# Patient Record
Sex: Female | Born: 1950
Health system: Southern US, Community
[De-identification: ages and names within clinical notes are randomized; demographics above are authoritative.]

## PROBLEM LIST (undated history)

## (undated) DIAGNOSIS — Z8041 Family history of malignant neoplasm of ovary: Secondary | ICD-10-CM

## (undated) DIAGNOSIS — F32A Depression, unspecified: Secondary | ICD-10-CM

## (undated) DIAGNOSIS — K589 Irritable bowel syndrome without diarrhea: Secondary | ICD-10-CM

## (undated) DIAGNOSIS — Z803 Family history of malignant neoplasm of breast: Secondary | ICD-10-CM

## (undated) DIAGNOSIS — M810 Age-related osteoporosis without current pathological fracture: Secondary | ICD-10-CM

## (undated) DIAGNOSIS — Z1371 Encounter for nonprocreative screening for genetic disease carrier status: Secondary | ICD-10-CM

## (undated) DIAGNOSIS — IMO0002 Reserved for concepts with insufficient information to code with codable children: Secondary | ICD-10-CM

## (undated) DIAGNOSIS — Z800 Family history of malignant neoplasm of digestive organs: Secondary | ICD-10-CM

## (undated) DIAGNOSIS — F329 Major depressive disorder, single episode, unspecified: Secondary | ICD-10-CM

## (undated) DIAGNOSIS — Z1379 Encounter for other screening for genetic and chromosomal anomalies: Secondary | ICD-10-CM

## (undated) HISTORY — DX: Reserved for concepts with insufficient information to code with codable children: IMO0002

## (undated) HISTORY — DX: Irritable bowel syndrome, unspecified: K58.9

## (undated) HISTORY — DX: Depression, unspecified: F32.A

## (undated) HISTORY — DX: Family history of malignant neoplasm of breast: Z80.3

## (undated) HISTORY — DX: Age-related osteoporosis without current pathological fracture: M81.0

## (undated) HISTORY — DX: Family history of malignant neoplasm of digestive organs: Z80.0

## (undated) HISTORY — DX: Major depressive disorder, single episode, unspecified: F32.9

## (undated) HISTORY — DX: Encounter for other screening for genetic and chromosomal anomalies: Z13.79

## (undated) HISTORY — DX: Family history of malignant neoplasm of ovary: Z80.41

---
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
## 1994-03-18 HISTORY — PX: NASAL SINUS SURGERY: SHX719

## 1997-09-22 ENCOUNTER — Other Ambulatory Visit: Admission: RE | Admit: 1997-09-22 | Discharge: 1997-09-22 | Payer: Self-pay | Admitting: Internal Medicine

## 1998-01-10 ENCOUNTER — Ambulatory Visit (HOSPITAL_COMMUNITY): Admission: RE | Admit: 1998-01-10 | Discharge: 1998-01-10 | Payer: Self-pay | Admitting: Obstetrics and Gynecology

## 1998-01-10 ENCOUNTER — Encounter: Payer: Self-pay | Admitting: Obstetrics and Gynecology

## 1999-01-03 ENCOUNTER — Ambulatory Visit (HOSPITAL_COMMUNITY): Admission: RE | Admit: 1999-01-03 | Discharge: 1999-01-03 | Payer: Self-pay | Admitting: Obstetrics and Gynecology

## 1999-01-03 ENCOUNTER — Encounter: Payer: Self-pay | Admitting: Obstetrics and Gynecology

## 1999-04-06 ENCOUNTER — Other Ambulatory Visit: Admission: RE | Admit: 1999-04-06 | Discharge: 1999-04-06 | Payer: Self-pay | Admitting: Obstetrics and Gynecology

## 2000-01-17 ENCOUNTER — Ambulatory Visit (HOSPITAL_COMMUNITY): Admission: RE | Admit: 2000-01-17 | Discharge: 2000-01-17 | Payer: Self-pay | Admitting: Obstetrics and Gynecology

## 2000-01-17 ENCOUNTER — Encounter: Payer: Self-pay | Admitting: Obstetrics and Gynecology

## 2000-07-08 ENCOUNTER — Other Ambulatory Visit: Admission: RE | Admit: 2000-07-08 | Discharge: 2000-07-08 | Payer: Self-pay | Admitting: Obstetrics and Gynecology

## 2001-02-02 ENCOUNTER — Ambulatory Visit (HOSPITAL_COMMUNITY): Admission: RE | Admit: 2001-02-02 | Discharge: 2001-02-02 | Payer: Self-pay | Admitting: Obstetrics and Gynecology

## 2001-02-02 ENCOUNTER — Encounter: Payer: Self-pay | Admitting: Obstetrics and Gynecology

## 2001-11-11 ENCOUNTER — Other Ambulatory Visit: Admission: RE | Admit: 2001-11-11 | Discharge: 2001-11-11 | Payer: Self-pay | Admitting: Obstetrics and Gynecology

## 2002-02-05 ENCOUNTER — Encounter: Payer: Self-pay | Admitting: Obstetrics and Gynecology

## 2002-02-05 ENCOUNTER — Ambulatory Visit (HOSPITAL_COMMUNITY): Admission: RE | Admit: 2002-02-05 | Discharge: 2002-02-05 | Payer: Self-pay | Admitting: Obstetrics and Gynecology

## 2002-04-28 ENCOUNTER — Inpatient Hospital Stay (HOSPITAL_COMMUNITY): Admission: AC | Admit: 2002-04-28 | Discharge: 2002-04-30 | Payer: Self-pay

## 2002-04-28 ENCOUNTER — Encounter: Payer: Self-pay | Admitting: Emergency Medicine

## 2002-04-29 ENCOUNTER — Encounter: Payer: Self-pay | Admitting: General Surgery

## 2002-11-19 ENCOUNTER — Other Ambulatory Visit: Admission: RE | Admit: 2002-11-19 | Discharge: 2002-11-19 | Payer: Self-pay | Admitting: Obstetrics and Gynecology

## 2003-02-18 ENCOUNTER — Ambulatory Visit (HOSPITAL_COMMUNITY): Admission: RE | Admit: 2003-02-18 | Discharge: 2003-02-18 | Payer: Self-pay | Admitting: Obstetrics and Gynecology

## 2003-12-12 ENCOUNTER — Other Ambulatory Visit: Admission: RE | Admit: 2003-12-12 | Discharge: 2003-12-12 | Payer: Self-pay | Admitting: Obstetrics and Gynecology

## 2004-03-05 ENCOUNTER — Ambulatory Visit (HOSPITAL_COMMUNITY): Admission: RE | Admit: 2004-03-05 | Discharge: 2004-03-05 | Payer: Self-pay | Admitting: Obstetrics and Gynecology

## 2004-03-18 DIAGNOSIS — IMO0002 Reserved for concepts with insufficient information to code with codable children: Secondary | ICD-10-CM

## 2004-03-18 DIAGNOSIS — R87619 Unspecified abnormal cytological findings in specimens from cervix uteri: Secondary | ICD-10-CM

## 2004-03-18 HISTORY — DX: Unspecified abnormal cytological findings in specimens from cervix uteri: R87.619

## 2004-03-18 HISTORY — DX: Reserved for concepts with insufficient information to code with codable children: IMO0002

## 2004-03-18 HISTORY — PX: COLPOSCOPY: SHX161

## 2004-08-17 ENCOUNTER — Ambulatory Visit (HOSPITAL_COMMUNITY): Admission: RE | Admit: 2004-08-17 | Discharge: 2004-08-17 | Payer: Self-pay | Admitting: Gastroenterology

## 2004-08-17 ENCOUNTER — Encounter (INDEPENDENT_AMBULATORY_CARE_PROVIDER_SITE_OTHER): Payer: Self-pay | Admitting: *Deleted

## 2005-01-04 ENCOUNTER — Other Ambulatory Visit: Admission: RE | Admit: 2005-01-04 | Discharge: 2005-01-04 | Payer: Self-pay | Admitting: Obstetrics and Gynecology

## 2005-01-16 HISTORY — PX: CLAVICLE SURGERY: SHX598

## 2005-03-01 ENCOUNTER — Ambulatory Visit (HOSPITAL_COMMUNITY): Admission: RE | Admit: 2005-03-01 | Discharge: 2005-03-01 | Payer: Self-pay | Admitting: Orthopedic Surgery

## 2005-05-31 ENCOUNTER — Other Ambulatory Visit: Admission: RE | Admit: 2005-05-31 | Discharge: 2005-05-31 | Payer: Self-pay | Admitting: Obstetrics and Gynecology

## 2005-10-04 ENCOUNTER — Other Ambulatory Visit: Admission: RE | Admit: 2005-10-04 | Discharge: 2005-10-04 | Payer: Self-pay | Admitting: Obstetrics and Gynecology

## 2005-10-30 ENCOUNTER — Ambulatory Visit (HOSPITAL_COMMUNITY): Admission: RE | Admit: 2005-10-30 | Discharge: 2005-10-30 | Payer: Self-pay | Admitting: Obstetrics and Gynecology

## 2006-01-24 ENCOUNTER — Other Ambulatory Visit: Admission: RE | Admit: 2006-01-24 | Discharge: 2006-01-24 | Payer: Self-pay | Admitting: Obstetrics and Gynecology

## 2006-03-18 DIAGNOSIS — M810 Age-related osteoporosis without current pathological fracture: Secondary | ICD-10-CM

## 2006-03-18 HISTORY — DX: Age-related osteoporosis without current pathological fracture: M81.0

## 2006-11-10 ENCOUNTER — Ambulatory Visit (HOSPITAL_COMMUNITY): Admission: RE | Admit: 2006-11-10 | Discharge: 2006-11-10 | Payer: Self-pay | Admitting: Obstetrics and Gynecology

## 2007-01-27 ENCOUNTER — Other Ambulatory Visit: Admission: RE | Admit: 2007-01-27 | Discharge: 2007-01-27 | Payer: Self-pay | Admitting: Obstetrics & Gynecology

## 2007-11-13 ENCOUNTER — Ambulatory Visit (HOSPITAL_COMMUNITY): Admission: RE | Admit: 2007-11-13 | Discharge: 2007-11-13 | Payer: Self-pay | Admitting: Gynecology

## 2008-01-28 ENCOUNTER — Other Ambulatory Visit: Admission: RE | Admit: 2008-01-28 | Discharge: 2008-01-28 | Payer: Self-pay | Admitting: Obstetrics and Gynecology

## 2008-11-15 ENCOUNTER — Ambulatory Visit (HOSPITAL_COMMUNITY): Admission: RE | Admit: 2008-11-15 | Discharge: 2008-11-15 | Payer: Self-pay | Admitting: Obstetrics and Gynecology

## 2009-11-23 ENCOUNTER — Ambulatory Visit (HOSPITAL_COMMUNITY): Admission: RE | Admit: 2009-11-23 | Discharge: 2009-11-23 | Payer: Self-pay | Admitting: Obstetrics and Gynecology

## 2010-08-03 NOTE — Op Note (Signed)
NAMEANAYA, BOVEE              ACCOUNT NO.:  [id number]   MEDICAL RECORD NO.:  [id number]          PATIENT TYPE:  AMB   LOCATION:  SDS                          FACILITY:  [facility]   PHYSICIAN:  Vania Rea. Supple, M.D.  DATE OF BIRTH:  [birth year]   DATE OF PROCEDURE:  03/01/2005  DATE OF DISCHARGE:  03/01/2005                                 OPERATIVE REPORT   PREOPERATIVE DIAGNOSIS:  Displaced left type 2 distal clavicle fracture.   POSTOPERATIVE DIAGNOSIS:  Displaced left type 2 distal clavicle fracture.   PROCEDURE:  Open reduction and internal fixation of displaced left type 2  distal clavicle fracture utilizing a coracoclavicular screw, 40 mm in  length.   SURGEON:  Vania Rea. Supple, M.D.   ASSISTANT:  Dava Najjar, P.A.-C.   ANESTHESIA:  General endotracheal anesthesia.   ESTIMATED BLOOD LOSS:  100 mL.   DRAINS:  None.   HISTORY:  Ms. Deangelo is a 60 year old female who approximately three weeks  ago fell landing directly on her left shoulder experiencing immediate pain  as well as prominence of the distal clavicle.  She was seen at an outside  facility where x-rays were obtained and followed up in our office earlier  this week where radiographs were repeated showing evidence for a displaced  type 2 distal clavicle fracture with significant prominence of the distal  clavicle into the soft tissues of the shoulder and moderate displacement and  malalignment.  Ms. Kreiser was counseled on treatment options and, at this  time, is brought to the operating room for planned ORIF due to the  significant displacement.  Preoperatively, we counseled Ms. Vint on  treatment options as well as risks versus benefits with possible  complications including bleeding, infection, neurovascular injury,  persistent pain, malunion, nonunion, loss of fixation, and possible need for  additional surgery were reviewed.  I also discussed the need for hardware  removal.  She understands, accepts, and  agrees to our planned procedure.   PROCEDURE IN DETAIL:  After undergoing routine preoperative evaluation, the  patient received prophylactic antibiotics.  She was transferred to the  operating room and placed supine on the operating table and underwent the  smooth induction of general endotracheal anesthesia.  The left shoulder  girdle region was sterilely prepped and draped in a standard fashion.  Fluoroscopic imaging was then utilized to identify the proper starting point  for the incision.  A 4 cm incision was made from anterior to posterior  across the distal clavicle just medial to the fracture site and centered  over the distal clavicle. Sharp dissection was carried down through the skin  and subcutaneous tissue and electrocautery was then used for deep dissection  and to split the deltopectoral fascia and divide the underlying musculature  to gain access to the distal clavicle.  The distal clavicle and fracture  site were then exposed with subperiosteal dissection.  The distal clavicle  was then mobilized and reduced towards the distal segment of the clavicle  fracture and then using fluoroscopic guidance, we confirmed appropriate  degree of reduction.  Once this was completed,  we then used the initial  drill bit for the DePuy coracoclavicular screw drilling through both  cortices of the clavicles medial segment.  The second smaller diameter screw  was then directed down towards the base of the coracoid and using  fluoroscopic guidance, this drill was then used to drill through the base of  the coracoid in a bicortical fashion.  The depth gauge was then passed and  the 40 mm screw was determined to be the appropriate length.  A 40 mm  coracoclavicular screw with a washer was then passed through the clavicle  drill hole then down into the base of the coracoid and obtained excellent  bony purchase and allowed excellent compression and reduction of the distal  clavicle such that the  medial segment nicely compressed against the lateral  segment.  Multiple fluoroscopic images were then used to confirm proper  positioning of the implant and good position across the fracture site.  The  wound was copiously irrigated.  Hemostasis was obtained.  The incision was  closed with 2-0 Vicryl for the deep fascia and underlying muscle belly,  subcu layer, and intracuticular 3-0 Monocryl used to close the skin followed  by Steri-Strips.  A bulky dry dressing was applied over the left shoulder.  The left arm was placed in a sling.  The patient was then extubated and  taken to the recovery room in stable condition.      Vania Rea. Supple, M.D.  Electronically Signed     KMS/MEDQ  D:  03/01/2005  T:  03/04/2005  Job:  [id number]

## 2010-11-16 ENCOUNTER — Other Ambulatory Visit: Payer: Self-pay | Admitting: Nurse Practitioner

## 2010-11-16 DIAGNOSIS — Z1231 Encounter for screening mammogram for malignant neoplasm of breast: Secondary | ICD-10-CM

## 2010-11-26 ENCOUNTER — Ambulatory Visit (HOSPITAL_COMMUNITY)
Admission: RE | Admit: 2010-11-26 | Discharge: 2010-11-26 | Disposition: A | Discharge status: Home / Self Care | Payer: Managed Care, Other (non HMO) | Source: Ambulatory Visit | Attending: Nurse Practitioner | Admitting: Nurse Practitioner

## 2010-11-26 DIAGNOSIS — Z1231 Encounter for screening mammogram for malignant neoplasm of breast: Secondary | ICD-10-CM | POA: Insufficient documentation

## 2011-12-05 ENCOUNTER — Other Ambulatory Visit: Payer: Self-pay | Admitting: Obstetrics and Gynecology

## 2011-12-05 DIAGNOSIS — Z1231 Encounter for screening mammogram for malignant neoplasm of breast: Secondary | ICD-10-CM

## 2011-12-16 ENCOUNTER — Ambulatory Visit (HOSPITAL_COMMUNITY)
Admission: RE | Admit: 2011-12-16 | Discharge: 2011-12-16 | Disposition: A | Discharge status: Home / Self Care | Payer: Managed Care, Other (non HMO) | Source: Ambulatory Visit | Attending: Obstetrics and Gynecology | Admitting: Obstetrics and Gynecology

## 2011-12-16 DIAGNOSIS — Z1231 Encounter for screening mammogram for malignant neoplasm of breast: Secondary | ICD-10-CM | POA: Insufficient documentation

## 2012-03-16 LAB — HM PAP SMEAR: HM PAP: NEGATIVE

## 2012-11-19 ENCOUNTER — Other Ambulatory Visit: Payer: Self-pay | Admitting: Nurse Practitioner

## 2012-11-19 DIAGNOSIS — Z1231 Encounter for screening mammogram for malignant neoplasm of breast: Secondary | ICD-10-CM

## 2012-12-17 ENCOUNTER — Ambulatory Visit (HOSPITAL_COMMUNITY)
Admission: RE | Admit: 2012-12-17 | Discharge: 2012-12-17 | Disposition: A | Discharge status: Home / Self Care | Payer: Managed Care, Other (non HMO) | Source: Ambulatory Visit | Attending: Nurse Practitioner | Admitting: Nurse Practitioner

## 2012-12-17 ENCOUNTER — Other Ambulatory Visit: Payer: Self-pay | Admitting: Nurse Practitioner

## 2012-12-17 DIAGNOSIS — Z1231 Encounter for screening mammogram for malignant neoplasm of breast: Secondary | ICD-10-CM

## 2013-02-25 ENCOUNTER — Other Ambulatory Visit: Payer: Self-pay | Admitting: Nurse Practitioner

## 2013-03-23 ENCOUNTER — Encounter: Payer: Self-pay | Admitting: Nurse Practitioner

## 2013-03-24 ENCOUNTER — Ambulatory Visit (INDEPENDENT_AMBULATORY_CARE_PROVIDER_SITE_OTHER): Payer: Managed Care, Other (non HMO) | Admitting: Nurse Practitioner

## 2013-03-24 ENCOUNTER — Encounter: Payer: Self-pay | Admitting: Nurse Practitioner

## 2013-03-24 VITALS — BP 110/66 | HR 72 | Ht 64.0 in | Wt 144.0 lb

## 2013-03-24 DIAGNOSIS — Z0000 Encounter for general adult medical examination without abnormal findings: Secondary | ICD-10-CM

## 2013-03-24 DIAGNOSIS — Z01419 Encounter for gynecological examination (general) (routine) without abnormal findings: Secondary | ICD-10-CM

## 2013-03-24 DIAGNOSIS — E559 Vitamin D deficiency, unspecified: Secondary | ICD-10-CM

## 2013-03-24 DIAGNOSIS — M810 Age-related osteoporosis without current pathological fracture: Secondary | ICD-10-CM | POA: Insufficient documentation

## 2013-03-24 DIAGNOSIS — N94810 Vulvar vestibulitis: Secondary | ICD-10-CM

## 2013-03-24 DIAGNOSIS — R82998 Other abnormal findings in urine: Secondary | ICD-10-CM

## 2013-03-24 DIAGNOSIS — R8290 Unspecified abnormal findings in urine: Secondary | ICD-10-CM

## 2013-03-24 LAB — POCT URINALYSIS DIPSTICK
Bilirubin, UA: NEGATIVE
GLUCOSE UA: NEGATIVE
Ketones, UA: NEGATIVE
NITRITE UA: NEGATIVE
UROBILINOGEN UA: NEGATIVE
pH, UA: 6

## 2013-03-24 MED ORDER — ESTRADIOL 2 MG VA RING: VAGINAL_RING | VAGINAL | Status: DC

## 2013-03-24 NOTE — Progress Notes (Signed)
Patient ID: Allison Thompson, female   DOB: [birth year], 63 y.o.   MRN: [id number] 63 y.o. G0P0 Single Caucasian Fe here for annual exam.  On Vit D RX at once monthly. No vaso symptoms.  Not SA for 3 years.  No LMP recorded. Patient is postmenopausal.          Sexually active: no  The current method of family planning is post menopausal status.    Exercising: yes  Gym/ health club routine includes [employer] 3-4 times per week. Smoker:  no  Health Maintenance: Pap: 03/16/12, WNL, neg HR HPV MMG:  12/17/12, 3D, Bi-Rads 1: negative Colonoscopy:  08/2004, normal, repeat 10 years BMD:  04/2011, results to orthopedist, Osteoporosis TDaP:  06/2010  Labs:  HB:  Gives blood q 2 months  Urine: Trace WBC, RBC, trace protein.   reports that she has never smoked. She has never used smokeless tobacco. She reports that she does not drink alcohol or use illicit drugs.  Past Medical History  Diagnosis Date  . Depression   . IBS (irritable bowel syndrome)   . Osteoporosis     Prolia  . Abnormal Pap smear 2006    LGSIL, pos HR HPV    Past Surgical History  Procedure Laterality Date  . Colposcopy  2006    CIN I, managed conservatively  . Umbilical hernia repair    . Forearm fracture surgery      rods placed, surgically removed later  . Nasal sinus surgery      deviated septum  . Clavicle surgery      after fracture, placed screw and later removed.    Current Outpatient Prescriptions  Medication Sig Dispense Refill  . Calcium Carbonate-Vitamin D (CALCIUM-D) 600-400 MG-UNIT TABS Take 1 tablet by mouth daily.      Marland Kitchen denosumab (PROLIA) 60 MG/ML SOLN injection Inject 60 mg into the skin every 6 (six) months. Administer in upper arm, thigh, or abdomen      . ergocalciferol (VITAMIN D2) 50000 UNITS capsule Take 50,000 Units by mouth once a week.      Marland Kitchen ESTRING 2 MG vaginal ring INSERT 1 RING VAGINALLY EVERY 3 MONTHS  1 each  0  . FLUoxetine (PROZAC) 20 MG capsule Take 60 mg by mouth daily.      .  Glucosamine 500 MG CAPS Take 1 capsule by mouth daily.      . Omega-3 Fatty Acids (FISH OIL PO) Take by mouth.      . zolpidem (AMBIEN) 5 MG tablet Take 5 mg by mouth at bedtime as needed for sleep.       No current facility-administered medications for this visit.    Family History  Problem Relation Age of Onset  . Kidney failure Mother   . Heart attack Mother   . Pancreatic cancer Cousin   . Heart disease Father   . Thyroid disease Sister   . Osteoporosis Sister   . Breast cancer Paternal Aunt   . Breast cancer Paternal Aunt     ROS:  Pertinent items are noted in HPI.  Otherwise, a comprehensive ROS was negative.  Exam:   BP 110/66  Pulse 72  Ht 5\' 4"  (1.626 m)  Wt 144 lb (65.318 kg)  BMI 24.71 kg/m2 Height: 5\' 4"  (162.6 cm)  Ht Readings from Last 3 Encounters:  03/24/13 5\' 4"  (1.626 m)    General appearance: alert, cooperative and appears stated age Head: Normocephalic, without obvious abnormality, atraumatic Neck: no adenopathy, supple, symmetrical,  trachea midline and thyroid normal to inspection and palpation Lungs: clear to auscultation bilaterally Breasts: normal appearance, no masses or tenderness Heart: regular rate and rhythm Abdomen: soft, non-tender; no masses,  no organomegaly Extremities: extremities normal, atraumatic, no cyanosis or edema Skin: Skin color, texture, turgor normal. No rashes or lesions Lymph nodes: Cervical, supraclavicular, and axillary nodes normal. No abnormal inguinal nodes palpated Neurologic: Grossly normal   Pelvic: External genitalia:  no lesions              Urethra:  normal appearing urethra with no masses, tenderness or lesions              Bartholin's and Skene's: normal                 Vagina: normal appearing vagina with normal color and discharge, no lesions              Cervix: anteverted              Pap taken: no Bimanual Exam:  Uterus:  normal size, contour, position, consistency, mobility, non-tender               Adnexa: no mass, fullness, tenderness               Rectovaginal: Confirms               Anus:  normal sphincter tone, no lesions  A:  Well Woman with normal exam  Postmenopausal  Atrophic vaginitis treated with Estring  Osteoporosis treated with Prolia since 04/2011  History of depression - death of partner 09-21-2010  History of Vulvar Vestibulitis - off Amytriptaline since not SA  P:   Pap smear as per guidelines   Mammogram due 10/15  Refill Estring for a year  She will return for labs - did not refill Vit D at this time awaiting labs - other labs are needed for the Prolia and she will bring list of labs here the next time she returns for lab and maybe can combine with our labs.  Counseled with risk of Vag E with DVT, CVA, cancer, etc  Counseled with diet , exercise, vit D , calcium, routine SBE return annually or prn  An After Visit Summary was printed and given to the patient.

## 2013-03-24 NOTE — Patient Instructions (Signed)

## 2013-03-26 LAB — URINE CULTURE
COLONY COUNT: NO GROWTH
ORGANISM ID, BACTERIA: NO GROWTH

## 2013-03-28 NOTE — Progress Notes (Signed)
Encounter reviewed by Dr. Brook Silva.  

## 2013-03-30 ENCOUNTER — Telehealth: Payer: Self-pay | Admitting: *Deleted

## 2013-03-30 NOTE — Telephone Encounter (Signed)
I have attempted to contact this patient by phone with the following results: left message to return my call on answering machine (mobile).  

## 2013-03-30 NOTE — Telephone Encounter (Signed)
Message copied by Graylon Good on Tue Mar 30, 2013  4:53 PM ------      Message from: Regina Eck      Created: Fri Mar 26, 2013  4:18 AM       Notify patient urine culture negative ------

## 2013-03-31 NOTE — Telephone Encounter (Signed)
Returning a call to [place]. Patient is aware Colletta [place] has gone for the day.Please call tomorrow.

## 2013-04-01 NOTE — Telephone Encounter (Signed)
Pt notified of results at [place].  Pt advised in message to call with any questions or if she begins having symptoms.

## 2013-04-29 ENCOUNTER — Other Ambulatory Visit (INDEPENDENT_AMBULATORY_CARE_PROVIDER_SITE_OTHER): Payer: Managed Care, Other (non HMO)

## 2013-04-29 DIAGNOSIS — E559 Vitamin D deficiency, unspecified: Secondary | ICD-10-CM

## 2013-04-29 DIAGNOSIS — Z0000 Encounter for general adult medical examination without abnormal findings: Secondary | ICD-10-CM

## 2013-04-30 LAB — LIPID PANEL
Cholesterol: 173 mg/dL (ref 0–200)
HDL: 68 mg/dL (ref >39)
LDL CALC: 94 mg/dL (ref 0–99)
Total CHOL/HDL Ratio: 2.5 Ratio
Triglycerides: 54 mg/dL (ref <150)
VLDL: 11 mg/dL (ref 0–40)

## 2013-04-30 LAB — COMPREHENSIVE METABOLIC PANEL
ALBUMIN: 4 g/dL (ref 3.5–5.2)
ALK PHOS: 41 U/L (ref 39–117)
ALT: 9 U/L (ref 0–35)
AST: 13 U/L (ref 0–37)
BUN: 11 mg/dL (ref 6–23)
CALCIUM: 8.9 mg/dL (ref 8.4–10.5)
CHLORIDE: 105 meq/L (ref 96–112)
CO2: 28 mEq/L (ref 19–32)
Creat: 0.8 mg/dL (ref 0.50–1.10)
Glucose, Bld: 82 mg/dL (ref 70–99)
POTASSIUM: 4.2 meq/L (ref 3.5–5.3)
SODIUM: 139 meq/L (ref 135–145)
Total Bilirubin: 0.5 mg/dL (ref 0.2–1.2)
Total Protein: 5.6 g/dL — ABNORMAL LOW (ref 6.0–8.3)

## 2013-04-30 LAB — CBC
HCT: 34.8 % — ABNORMAL LOW (ref 36.0–46.0)
HEMOGLOBIN: 11.7 g/dL — AB (ref 12.0–15.0)
MCH: 28.8 pg (ref 26.0–34.0)
MCHC: 33.6 g/dL (ref 30.0–36.0)
MCV: 85.7 fL (ref 78.0–100.0)
Platelets: 145 10*3/uL — ABNORMAL LOW (ref 150–400)
RBC: 4.06 MIL/uL (ref 3.87–5.11)
RDW: 13.6 % (ref 11.5–15.5)
WBC: 3.3 10*3/uL — ABNORMAL LOW (ref 4.0–10.5)

## 2013-04-30 LAB — TSH: TSH: 1.693 u[IU]/mL (ref 0.350–4.500)

## 2013-04-30 LAB — VITAMIN D 25 HYDROXY (VIT D DEFICIENCY, FRACTURES): VIT D 25 HYDROXY: 49 ng/mL (ref 30–89)

## 2013-05-03 ENCOUNTER — Telehealth: Payer: Self-pay | Admitting: Nurse Practitioner

## 2013-05-03 NOTE — Telephone Encounter (Signed)
They want the results from the blood work that was done here last.   Fax: [phone]  Put attention Aram Candela

## 2013-05-13 ENCOUNTER — Other Ambulatory Visit: Payer: Self-pay

## 2013-05-19 ENCOUNTER — Other Ambulatory Visit (INDEPENDENT_AMBULATORY_CARE_PROVIDER_SITE_OTHER): Payer: Managed Care, Other (non HMO)

## 2013-05-19 DIAGNOSIS — Z0000 Encounter for general adult medical examination without abnormal findings: Secondary | ICD-10-CM

## 2013-05-19 DIAGNOSIS — R899 Unspecified abnormal finding in specimens from other organs, systems and tissues: Secondary | ICD-10-CM

## 2013-05-20 ENCOUNTER — Other Ambulatory Visit: Payer: Self-pay | Admitting: Nurse Practitioner

## 2013-05-20 DIAGNOSIS — R899 Unspecified abnormal finding in specimens from other organs, systems and tissues: Secondary | ICD-10-CM

## 2013-05-20 LAB — CBC WITH DIFFERENTIAL/PLATELET
BASOS ABS: 0 10*3/uL (ref 0.0–0.1)
EOS ABS: 0 10*3/uL (ref 0.0–0.7)
HCT: 35.2 % — ABNORMAL LOW (ref 36.0–46.0)
Hemoglobin: 12.2 g/dL (ref 12.0–15.0)
LYMPHS PCT: 31 % (ref 12–46)
Lymphs Abs: 1 10*3/uL (ref 0.7–4.0)
MCH: 30 pg (ref 26.0–34.0)
MCHC: 34.6 g/dL (ref 30.0–36.0)
MCV: 86.5 fL (ref 78.0–100.0)
Monocytes Absolute: 0.2 10*3/uL (ref 0.1–1.0)
Monocytes Relative: 5 % (ref 3–12)
NEUTROS PCT: 64 % (ref 43–77)
Neutro Abs: 2 10*3/uL (ref 1.7–7.7)
PLATELETS: 146 10*3/uL — AB (ref 150–400)
RBC: 4.07 MIL/uL (ref 3.87–5.11)
RDW: 12.8 % (ref 11.5–15.5)
WBC: 3.1 10*3/uL — AB (ref 4.0–10.5)

## 2013-05-21 ENCOUNTER — Telehealth: Payer: Self-pay | Admitting: Nurse Practitioner

## 2013-05-21 NOTE — Telephone Encounter (Signed)
Pt returning call to [place] or [place].

## 2013-05-23 NOTE — Telephone Encounter (Signed)
Patient was informed of still low WBC.  I have consulted with Dr. Quincy Simmonds and we have agreed to recheck this in 5-6 weeks as this still may be from her giving blood every 2 months.  She is informed and has agreed and plans for a recheck of CBC with Diff in that time.  She will not be donating blood in the interim.  After review of the next CBC if needed will make appointment for a hematologist.

## 2013-08-12 ENCOUNTER — Other Ambulatory Visit: Payer: Self-pay | Admitting: Obstetrics and Gynecology

## 2013-08-12 ENCOUNTER — Telehealth: Payer: Self-pay | Admitting: Nurse Practitioner

## 2013-08-12 DIAGNOSIS — D72819 Decreased white blood cell count, unspecified: Secondary | ICD-10-CM

## 2013-08-12 NOTE — Telephone Encounter (Signed)
Done

## 2013-08-12 NOTE — Telephone Encounter (Signed)
Patient is calling saying she thinks she is supposed to have another lab appt. 6 to 8 weeks from her last one But i dont see anything can you check this for me. The only order i see if for one to be collected in march.

## 2013-08-12 NOTE — Telephone Encounter (Signed)
Spoke with pt who was supposed to have a repeat CBC w/diff in May per PG and BS. Scheduled lab appt for tomorrow at 1:30 per pt request. Dr. Quincy Simmonds, can you enter order for CBC?

## 2013-08-13 ENCOUNTER — Other Ambulatory Visit: Payer: Managed Care, Other (non HMO)

## 2013-08-13 DIAGNOSIS — D72819 Decreased white blood cell count, unspecified: Secondary | ICD-10-CM

## 2013-08-13 LAB — CBC WITH DIFFERENTIAL/PLATELET
Basophils Absolute: 0 10*3/uL (ref 0.0–0.1)
Basophils Relative: 1 % (ref 0–1)
Eosinophils Absolute: 0.2 10*3/uL (ref 0.0–0.7)
Eosinophils Relative: 4 % (ref 0–5)
HCT: 39.5 % (ref 36.0–46.0)
HEMOGLOBIN: 13.1 g/dL (ref 12.0–15.0)
LYMPHS ABS: 1.3 10*3/uL (ref 0.7–4.0)
LYMPHS PCT: 32 % (ref 12–46)
MCH: 28.2 pg (ref 26.0–34.0)
MCHC: 33.2 g/dL (ref 30.0–36.0)
MCV: 84.9 fL (ref 78.0–100.0)
Monocytes Absolute: 0.3 10*3/uL (ref 0.1–1.0)
Monocytes Relative: 7 % (ref 3–12)
NEUTROS PCT: 56 % (ref 43–77)
Neutro Abs: 2.2 10*3/uL (ref 1.7–7.7)
PLATELETS: 185 10*3/uL (ref 150–400)
RBC: 4.65 MIL/uL (ref 3.87–5.11)
RDW: 13.4 % (ref 11.5–15.5)
WBC: 4 10*3/uL (ref 4.0–10.5)

## 2013-08-16 NOTE — Telephone Encounter (Signed)
Patient had lab appointment. Will close encounter.

## 2013-11-24 ENCOUNTER — Other Ambulatory Visit: Payer: Self-pay | Admitting: Nurse Practitioner

## 2013-11-24 DIAGNOSIS — Z1231 Encounter for screening mammogram for malignant neoplasm of breast: Secondary | ICD-10-CM

## 2013-12-23 ENCOUNTER — Ambulatory Visit (HOSPITAL_COMMUNITY): Payer: Managed Care, Other (non HMO)

## 2013-12-31 ENCOUNTER — Ambulatory Visit (HOSPITAL_COMMUNITY)
Admission: RE | Admit: 2013-12-31 | Discharge: 2013-12-31 | Disposition: A | Discharge status: Home / Self Care | Payer: Managed Care, Other (non HMO) | Source: Ambulatory Visit | Attending: Nurse Practitioner | Admitting: Nurse Practitioner

## 2013-12-31 DIAGNOSIS — Z1231 Encounter for screening mammogram for malignant neoplasm of breast: Secondary | ICD-10-CM | POA: Diagnosis present

## 2014-03-28 ENCOUNTER — Encounter: Payer: Self-pay | Admitting: Nurse Practitioner

## 2014-03-28 ENCOUNTER — Ambulatory Visit (INDEPENDENT_AMBULATORY_CARE_PROVIDER_SITE_OTHER): Payer: BLUE CROSS/BLUE SHIELD | Admitting: Nurse Practitioner

## 2014-03-28 VITALS — BP 100/70 | HR 60 | Resp 18 | Ht 63.75 in | Wt 141.0 lb

## 2014-03-28 DIAGNOSIS — R8290 Unspecified abnormal findings in urine: Secondary | ICD-10-CM

## 2014-03-28 DIAGNOSIS — Z01419 Encounter for gynecological examination (general) (routine) without abnormal findings: Secondary | ICD-10-CM

## 2014-03-28 DIAGNOSIS — Z0000 Encounter for general adult medical examination without abnormal findings: Secondary | ICD-10-CM

## 2014-03-28 LAB — COMPREHENSIVE METABOLIC PANEL
ALBUMIN: 4 g/dL (ref 3.5–5.2)
ALT: 16 U/L (ref 0–35)
AST: 16 U/L (ref 0–37)
Alkaline Phosphatase: 41 U/L (ref 39–117)
BILIRUBIN TOTAL: 0.7 mg/dL (ref 0.2–1.2)
BUN: 12 mg/dL (ref 6–23)
CHLORIDE: 103 meq/L (ref 96–112)
CO2: 28 meq/L (ref 19–32)
Calcium: 8.9 mg/dL (ref 8.4–10.5)
Creat: 0.78 mg/dL (ref 0.50–1.10)
GLUCOSE: 82 mg/dL (ref 70–99)
Potassium: 4 mEq/L (ref 3.5–5.3)
Sodium: 139 mEq/L (ref 135–145)
Total Protein: 6.2 g/dL (ref 6.0–8.3)

## 2014-03-28 LAB — LIPID PANEL
Cholesterol: 203 mg/dL — ABNORMAL HIGH (ref 0–200)
HDL: 82 mg/dL (ref >39)
LDL Cholesterol: 105 mg/dL — ABNORMAL HIGH (ref 0–99)
Total CHOL/HDL Ratio: 2.5 Ratio
Triglycerides: 79 mg/dL (ref <150)
VLDL: 16 mg/dL (ref 0–40)

## 2014-03-28 LAB — POCT URINALYSIS DIPSTICK
Bilirubin, UA: NEGATIVE
Blood, UA: NEGATIVE
Glucose, UA: NEGATIVE
Ketones, UA: NEGATIVE
Nitrite, UA: NEGATIVE
Protein, UA: NEGATIVE
Urobilinogen, UA: NEGATIVE
pH, UA: 5

## 2014-03-28 LAB — TSH: TSH: 1.159 u[IU]/mL (ref 0.350–4.500)

## 2014-03-28 MED ORDER — ESTRADIOL 2 MG VA RING: VAGINAL_RING | VAGINAL | Status: DC

## 2014-03-28 NOTE — Patient Instructions (Signed)

## 2014-03-28 NOTE — Progress Notes (Signed)
64 y.o. G1P0010 Single Caucasian Fe here for annual exam.  No vaso symptoms. No mood changes. No new health problems. Prolia X 2 years. Previous Forteo 03/29/2009 X 2 years. No UA symptoms.  Patient's last menstrual period was 08/16/2004.          Sexually active: No.  The current method of family planning is post menopausal status.    Exercising: Yes.    Curves 2 - 3 x weekly Smoker:  no  Health Maintenance: Pap: 02/2012 Neg. HR HPV:Neg  History of CIN I  01/28/2005 with Colpo biopsy benign  MMG: 01/03/14 BIRADS1:neg Colonoscopy:  08/2004 Repeat 10 year will schedule later this summer BMD:   04/2013 Improved Osteoporosis - By Dr. Layne Benton  TDaP: 06/2010 Labs: Here today UA:WBC=Small Hg:13.5   reports that she has never smoked. She has never used smokeless tobacco. She reports that she does not drink alcohol or use illicit drugs.  Past Medical History  Diagnosis Date  . Depression   . IBS (irritable bowel syndrome)   . Abnormal Pap smear 2006    LGSIL, pos HR HPV  . Osteoporosis 2008    Prolia since 04/2011 given by Ortho    Past Surgical History  Procedure Laterality Date  . Colposcopy  2006    CIN I, managed conservatively  . Umbilical hernia repair  1958  . Forearm fracture surgery Right 1967    rods placed, surgically removed later  . Nasal sinus surgery  1996    deviated septum  . Clavicle surgery  11/06    after fracture, placed screw and later removed.    Current Outpatient Prescriptions  Medication Sig Dispense Refill  . Calcium Carbonate-Vitamin D (CALCIUM-D) 600-400 MG-UNIT TABS Take 1 tablet by mouth daily.    . cholecalciferol (VITAMIN D) 1000 UNITS tablet Take 1,000 Units by mouth daily.    Marland Kitchen denosumab (PROLIA) 60 MG/ML SOLN injection Inject 60 mg into the skin every 6 (six) months. Administer in upper arm, thigh, or abdomen    . EPIPEN 2-PAK 0.3 MG/0.3ML SOAJ injection See admin instructions.  1  . estradiol (ESTRING) 2 MG vaginal ring INSERT 1 RING VAGINALLY  EVERY 3 MONTHS 1 each 3  . FLUoxetine (PROZAC) 20 MG capsule Take 60 mg by mouth daily.    . Glucosamine 500 MG CAPS Take 1 capsule by mouth daily.    . Omega-3 Fatty Acids (FISH OIL PO) Take by mouth.    . zolpidem (AMBIEN) 10 MG tablet Take 5 mg by mouth at bedtime as needed.      No current facility-administered medications for this visit.    @FAM  HX@  ROS:  Pertinent items are noted in HPI.  Otherwise, a comprehensive ROS was negative.  Exam:   BP 100/70 mmHg  Pulse 60  Resp 18  Ht 5' 3.75" (1.619 m)  Wt 141 lb (63.957 kg)  BMI 24.40 kg/m2  LMP 08/16/2004 Height: 5' 3.75" (161.9 cm) @LAST  HT(3)@  General appearance: alert, cooperative and appears stated age Head: Normocephalic, without obvious abnormality, atraumatic Neck: no adenopathy, supple, symmetrical, trachea midline and thyroid normal to inspection and palpation Lungs: clear to auscultation bilaterally Breasts: normal appearance, no masses or tenderness Heart: regular rate and rhythm Abdomen: soft, non-tender; no masses,  no organomegaly Extremities: extremities normal, atraumatic, no cyanosis or edema Skin: Skin color, texture, turgor normal. No rashes or lesions Lymph nodes: Cervical, supraclavicular, and axillary nodes normal. No abnormal inguinal nodes palpated Neurologic: Grossly normal   Pelvic: External  genitalia:  no lesions              Urethra:  normal appearing urethra with no masses, tenderness or lesions              Bartholin's and Skene's: normal                 Vagina: normal appearing vagina with normal color and discharge, no lesions              Cervix: anteverted              Pap taken: No. Bimanual Exam:  Uterus:  normal size, contour, position, consistency, mobility, non-tender              Adnexa: no mass, fullness, tenderness               Rectovaginal: Confirms               Anus:  normal sphincter tone, no lesions  Chaperone present: Yes  A:  Well Woman with normal  exam  Postmenopausal Atrophic vaginitis treated with Estring Osteoporosis treated with Prolia since 04/2011 X 2  Previously treated with Forteo 03/29/2009 X 2 years History of depression - death of partner 2010/11/04 History of Vulvar Vestibulitis - off Amytriptaline since not SA  P:   Reviewed health and wellness pertinent to exam  Pap smear not taken today  Mammogram is due 10/15  Colonoscopy is due 08/2014  Refill on Estring for a year - given new coupon  Counseled with risk of DVT, CVA, cancer, etc  Counseled on breast self exam, mammography screening, use and side effects of HRT, adequate intake of calcium and vitamin D, diet and exercise, Kegel's exercises return annually or prn  An After Visit Summary was printed and given to the patient.

## 2014-03-29 LAB — URINALYSIS, MICROSCOPIC ONLY
Bacteria, UA: NONE SEEN
CASTS: NONE SEEN
CRYSTALS: NONE SEEN

## 2014-03-29 LAB — HEMOGLOBIN, FINGERSTICK: Hemoglobin, fingerstick: 13.5 g/dL (ref 12.0–16.0)

## 2014-03-29 LAB — VITAMIN D 25 HYDROXY (VIT D DEFICIENCY, FRACTURES): VIT D 25 HYDROXY: 51 ng/mL (ref 30–100)

## 2014-03-29 NOTE — Progress Notes (Signed)
Encounter reviewed by Dr. Brook Silva.  

## 2014-03-30 LAB — URINE CULTURE
COLONY COUNT: NO GROWTH
ORGANISM ID, BACTERIA: NO GROWTH

## 2014-04-01 ENCOUNTER — Telehealth: Payer: Self-pay | Admitting: *Deleted

## 2014-04-01 NOTE — Telephone Encounter (Signed)
-----   Message from Milford Cage, [place] sent at 03/30/2014 12:58 PM EST ----- Let parent know that urine culture is negative.

## 2014-04-01 NOTE — Telephone Encounter (Signed)
I have attempted to contact this patient by phone with the following results: left message to return call to [place] at [phone] answering machine (home per [facility]).  No personal information given-name was not identified in voicemail.  [phone] (Home), no connection with mobile number, unable to leave message.

## 2014-04-01 NOTE — Telephone Encounter (Signed)
Returning call.

## 2014-04-06 NOTE — Telephone Encounter (Signed)
I have attempted to contact this patient by phone with the following results: left message to return call to [place] at [phone] answering machine (home per [facility]). Advised call was regarding recent labs and if I am unavailable to ask for a triage nurse to review these with her. [phone] (Home), no connection with mobile number, unable to leave message.

## 2014-04-06 NOTE — Telephone Encounter (Signed)
Patient notified see result note 

## 2014-04-15 ENCOUNTER — Telehealth: Payer: Self-pay | Admitting: Nurse Practitioner

## 2014-04-15 MED ORDER — ESTRADIOL 2 MG VA RING: VAGINAL_RING | VAGINAL | Status: DC

## 2014-04-15 NOTE — Telephone Encounter (Signed)
Medication refill request: Estring 2Mg  Last AEX:  03/28/14 Next AEX: 03/30/15 Last MMG (if hormonal medication request): 01/03/14 BIRADS1:Neg Refill authorized: 03/28/14 #1each/3R.

## 2014-04-15 NOTE — Telephone Encounter (Signed)
Patient is asking to pick up a hard copy prescription for Estring and take to her pharmacy with a coupon. (due to cost)

## 2014-04-15 NOTE — Telephone Encounter (Addendum)
LM for pt Rx and coupon to pick up on Monday.

## 2014-11-17 ENCOUNTER — Other Ambulatory Visit: Payer: Self-pay | Admitting: Nurse Practitioner

## 2014-11-17 NOTE — Telephone Encounter (Signed)
Medication refill request: Estring Vaginal Ring Last AEX:  03/28/2014 with PG Next AEX:  04/03/15 with PG Last MMG (if hormonal medication request): 01/03/2014 breast density category b; bi-rads 1: negative Refill authorized: #1/2  04/15/14 #1/3 rfs was sent to [facility] Left Message To Call Back to see where patient is needing rx sent to.

## 2014-11-17 NOTE — Telephone Encounter (Signed)
Patient says she now gets her estring from her [facility] and they have a current rx she said she has an additional refill that will last her until her appt with Ms. Patty in January.

## 2014-12-26 ENCOUNTER — Other Ambulatory Visit: Payer: Self-pay

## 2014-12-26 DIAGNOSIS — Z1231 Encounter for screening mammogram for malignant neoplasm of breast: Secondary | ICD-10-CM

## 2015-01-03 ENCOUNTER — Ambulatory Visit
Admission: RE | Admit: 2015-01-03 | Discharge: 2015-01-03 | Disposition: A | Discharge status: Home / Self Care | Payer: BLUE CROSS/BLUE SHIELD | Source: Ambulatory Visit

## 2015-01-03 DIAGNOSIS — Z1231 Encounter for screening mammogram for malignant neoplasm of breast: Secondary | ICD-10-CM

## 2015-01-29 ENCOUNTER — Other Ambulatory Visit: Payer: Self-pay | Admitting: Nurse Practitioner

## 2015-01-30 NOTE — Telephone Encounter (Signed)
Medication refill request: Estring  Last AEX:  03-28-14 Next AEX: 04-03-15  Last MMG (if hormonal medication request): 01-04-15 WNL Refill authorized: please advise

## 2015-03-30 ENCOUNTER — Ambulatory Visit: Payer: BLUE CROSS/BLUE SHIELD | Admitting: Nurse Practitioner

## 2015-04-03 ENCOUNTER — Ambulatory Visit (INDEPENDENT_AMBULATORY_CARE_PROVIDER_SITE_OTHER): Payer: BLUE CROSS/BLUE SHIELD | Admitting: Nurse Practitioner

## 2015-04-03 ENCOUNTER — Encounter: Payer: Self-pay | Admitting: Nurse Practitioner

## 2015-04-03 VITALS — BP 90/60 | HR 64 | Resp 16 | Ht 63.75 in | Wt 145.0 lb

## 2015-04-03 DIAGNOSIS — Z0000 Encounter for general adult medical examination without abnormal findings: Secondary | ICD-10-CM | POA: Diagnosis not present

## 2015-04-03 DIAGNOSIS — N952 Postmenopausal atrophic vaginitis: Secondary | ICD-10-CM

## 2015-04-03 DIAGNOSIS — Z01419 Encounter for gynecological examination (general) (routine) without abnormal findings: Secondary | ICD-10-CM

## 2015-04-03 LAB — POCT URINALYSIS DIPSTICK
Bilirubin, UA: NEGATIVE
Glucose, UA: NEGATIVE
Ketones, UA: NEGATIVE
NITRITE UA: NEGATIVE
PH UA: 6
Protein, UA: NEGATIVE
RBC UA: NEGATIVE
UROBILINOGEN UA: NEGATIVE

## 2015-04-03 LAB — COMPREHENSIVE METABOLIC PANEL
ALT: 15 U/L (ref 6–29)
AST: 16 U/L (ref 10–35)
Albumin: 3.9 g/dL (ref 3.6–5.1)
Alkaline Phosphatase: 39 U/L (ref 33–130)
BILIRUBIN TOTAL: 0.6 mg/dL (ref 0.2–1.2)
BUN: 11 mg/dL (ref 7–25)
CALCIUM: 8.9 mg/dL (ref 8.6–10.4)
CO2: 28 mmol/L (ref 20–31)
CREATININE: 0.76 mg/dL (ref 0.50–0.99)
Chloride: 101 mmol/L (ref 98–110)
GLUCOSE: 79 mg/dL (ref 65–99)
Potassium: 4 mmol/L (ref 3.5–5.3)
SODIUM: 139 mmol/L (ref 135–146)
Total Protein: 6 g/dL — ABNORMAL LOW (ref 6.1–8.1)

## 2015-04-03 LAB — CBC WITH DIFFERENTIAL/PLATELET
Basophils Absolute: 0 10*3/uL (ref 0.0–0.1)
Basophils Relative: 1 % (ref 0–1)
EOS ABS: 0.1 10*3/uL (ref 0.0–0.7)
Eosinophils Relative: 3 % (ref 0–5)
HCT: 42.8 % (ref 36.0–46.0)
Hemoglobin: 13.9 g/dL (ref 12.0–15.0)
LYMPHS ABS: 1.2 10*3/uL (ref 0.7–4.0)
Lymphocytes Relative: 30 % (ref 12–46)
MCH: 28.2 pg (ref 26.0–34.0)
MCHC: 32.5 g/dL (ref 30.0–36.0)
MCV: 86.8 fL (ref 78.0–100.0)
MONO ABS: 0.3 10*3/uL (ref 0.1–1.0)
MONOS PCT: 8 % (ref 3–12)
MPV: 9.5 fL (ref 8.6–12.4)
NEUTROS PCT: 58 % (ref 43–77)
Neutro Abs: 2.4 10*3/uL (ref 1.7–7.7)
PLATELETS: 173 10*3/uL (ref 150–400)
RBC: 4.93 MIL/uL (ref 3.87–5.11)
RDW: 13.7 % (ref 11.5–15.5)
WBC: 4.1 10*3/uL (ref 4.0–10.5)

## 2015-04-03 LAB — LIPID PANEL
Cholesterol: 205 mg/dL — ABNORMAL HIGH (ref 125–200)
HDL: 83 mg/dL (ref >=46)
LDL CALC: 106 mg/dL (ref <130)
Total CHOL/HDL Ratio: 2.5 Ratio (ref <=5.0)
Triglycerides: 81 mg/dL (ref <150)
VLDL: 16 mg/dL (ref <30)

## 2015-04-03 LAB — HEMOGLOBIN, FINGERSTICK: Hemoglobin, fingerstick: 13.4 g/dL (ref 12.0–16.0)

## 2015-04-03 LAB — TSH: TSH: 1.795 u[IU]/mL (ref 0.350–4.500)

## 2015-04-03 MED ORDER — ESTRADIOL 2 MG VA RING: VAGINAL_RING | VAGINAL | Status: DC

## 2015-04-03 NOTE — Patient Instructions (Signed)

## 2015-04-03 NOTE — Progress Notes (Signed)
65 y.o. G1P0010 Single  Caucasian Fe here for annual exam.  Not dating or SA. Occasionally night sweats.  Off Estring for about 2-3 months secondary cost.  Wants to get a new RX and she will use the coupon. . Patient's last menstrual period was 08/16/2004.          Sexually active: No.  The current method of family planning is post menopausal status.    Exercising: Yes.    Curves Smoker:  no  Health Maintenance: Pap:  03/06/12  Neg. HR HPV:neg. History of CIN I 01/28/2005 with Colpo biopsy benign  MMG:  01/04/15 BIRADS1:neg Colonoscopy:  08/2004 Repeat 10 years  BMD:   04/06/14 Osteoporosis by Dr. Layne Benton  TDaP:  2012 Shingles: No Pneumonia: No Hep C and HIV: No Labs: Here today Hg:13.4 UA: WBC=Trace   reports that she has never smoked. She has never used smokeless tobacco. She reports that she does not drink alcohol or use illicit drugs.  Past Medical History  Diagnosis Date  . Depression   . IBS (irritable bowel syndrome)   . Abnormal Pap smear 2006    LGSIL, pos HR HPV  . Osteoporosis 2008    Prolia since 04/2011 given by Ortho    Past Surgical History  Procedure Laterality Date  . Colposcopy  2006    CIN I, managed conservatively  . Umbilical hernia repair  1958  . Forearm fracture surgery Right 1967    rods placed, surgically removed later  . Nasal sinus surgery  1996    deviated septum  . Clavicle surgery  11/06    after fracture, placed screw and later removed.    Current Outpatient Prescriptions  Medication Sig Dispense Refill  . Calcium Carbonate-Vitamin D (CALCIUM-D) 600-400 MG-UNIT TABS Take 1 tablet by mouth daily.    . cholecalciferol (VITAMIN D) 1000 UNITS tablet Take 1,000 Units by mouth daily.    Marland Kitchen denosumab (PROLIA) 60 MG/ML SOLN injection Inject 60 mg into the skin every 6 (six) months. Administer in upper arm, thigh, or abdomen    . EPIPEN 2-PAK 0.3 MG/0.3ML SOAJ injection See admin instructions.  1  . estradiol (ESTRING) 2 MG vaginal ring INSERT 1  RING VAGINALLY ONCE EVERY 3 MONTHS 1 each 4  . FLUoxetine (PROZAC) 20 MG capsule Take 60 mg by mouth daily.    . Glucosamine 500 MG CAPS Take 1 capsule by mouth daily.    . Omega-3 Fatty Acids (FISH OIL PO) Take by mouth.    . zolpidem (AMBIEN) 10 MG tablet Take 5 mg by mouth at bedtime as needed.      No current facility-administered medications for this visit.    Family History  Problem Relation Age of Onset  . Kidney failure Mother   . Heart attack Mother   . Pancreatic cancer Cousin   . Heart disease Father   . Thyroid disease Sister   . Osteoporosis Sister   . Breast cancer Paternal Aunt   . Cancer Paternal Aunt     Pancreas  . Breast cancer Paternal Aunt     ROS:  Pertinent items are noted in HPI.  Otherwise, a comprehensive ROS was negative.  Exam:   BP 90/60 mmHg  Pulse 64  Resp 16  Ht 5' 3.75" (1.619 m)  Wt 145 lb (65.772 kg)  BMI 25.09 kg/m2  LMP 08/16/2004 Height: 5' 3.75" (161.9 cm) Ht Readings from Last 3 Encounters:  04/03/15 5' 3.75" (1.619 m)  03/28/14 5' 3.75" (1.619 m)  03/24/13 5\' 4"  (1.626 m)    General appearance: alert, cooperative and appears stated age Head: Normocephalic, without obvious abnormality, atraumatic Neck: no adenopathy, supple, symmetrical, trachea midline and thyroid normal to inspection and palpation Lungs: clear to auscultation bilaterally Breasts: normal appearance, no masses or tenderness Heart: regular rate and rhythm Abdomen: soft, non-tender; no masses,  no organomegaly Extremities: extremities normal, atraumatic, no cyanosis or edema Skin: Skin color, texture, turgor normal. No rashes or lesions Lymph nodes: Cervical, supraclavicular, and axillary nodes normal. No abnormal inguinal nodes palpated Neurologic: Grossly normal   Pelvic: External genitalia:  no lesions              Urethra:  normal appearing urethra with no masses, tenderness or lesions              Bartholin's and Skene's: normal                 Vagina:  normal appearing vagina with normal color and discharge, no lesions              Cervix: anteverted              Pap taken: Yes.   Bimanual Exam:  Uterus:  normal size, contour, position, consistency, mobility, non-tender              Adnexa: no mass, fullness, tenderness               Rectovaginal: Confirms               Anus:  normal sphincter tone, no lesions  Chaperone present: yes  A:  Well Woman with normal exam  Postmenopausal Atrophic vaginitis treated with Estring Osteoporosis treated with Prolia since 04/2011 X 3 years Previously treated with Forteo 03/29/2009 X 2 years History of depression - death of partner 10/09/2010 History of Vulvar Vestibulitis - off Amytriptaline since not SA  P:   Reviewed health and wellness pertinent to exam  Pap smear as above  Mammogram is due 12/2015  Refill of Estring for a year  Counseled with risk of CVA, DVT, cancer, etc  Will follow with labs  Counseled on breast self exam, mammography screening, use and side effects of HRT, osteoporosis, adequate intake of calcium and vitamin D, diet and exercise return annually or prn  An After Visit Summary was printed and given to the patient.

## 2015-04-04 LAB — HIV ANTIBODY (ROUTINE TESTING W REFLEX): HIV: NONREACTIVE

## 2015-04-04 LAB — HEPATITIS C ANTIBODY: HCV AB: NEGATIVE

## 2015-04-04 LAB — VITAMIN D 25 HYDROXY (VIT D DEFICIENCY, FRACTURES): VIT D 25 HYDROXY: 39 ng/mL (ref 30–100)

## 2015-04-05 LAB — IPS PAP TEST WITH HPV

## 2015-04-05 NOTE — Progress Notes (Signed)
Encounter reviewed by Dr. Brook Amundson C. Silva.  

## 2015-08-07 ENCOUNTER — Encounter: Payer: Self-pay | Admitting: Nurse Practitioner

## 2015-08-07 ENCOUNTER — Ambulatory Visit (INDEPENDENT_AMBULATORY_CARE_PROVIDER_SITE_OTHER): Payer: BLUE CROSS/BLUE SHIELD | Admitting: Nurse Practitioner

## 2015-08-07 VITALS — BP 114/72 | HR 76 | Ht 63.75 in | Wt 143.0 lb

## 2015-08-07 DIAGNOSIS — Z803 Family history of malignant neoplasm of breast: Secondary | ICD-10-CM

## 2015-08-07 DIAGNOSIS — Z800 Family history of malignant neoplasm of digestive organs: Secondary | ICD-10-CM | POA: Diagnosis not present

## 2015-08-07 DIAGNOSIS — Z8041 Family history of malignant neoplasm of ovary: Secondary | ICD-10-CM

## 2015-08-07 NOTE — Progress Notes (Signed)
64 y.o. Single Caucasian female [id number] here for consultation.  Her sister age 69 was recently diagnosed with OV cancer.  With that the sister had genetic testing positive for mutations that is at risk for breast, OV, pancreatic, prostate cancer.  All this information is new and sister has just had surgery and will be having chemo.  Patient was told that she also should be tested.  She comes in to get a referral and see what else she needs to have done.  Sister has a daughter age 31 and has not yet been tested.   Other FMH: 2 paternal aunts with breast cancer about age 55 & 68 One of the aunts then had pancreatic cancer age 81 1 paternal cousin with pancreatic cancer age 51 and passed within 6 months   Pt had colonoscopy in 05/2015 and was normal with a recheck in 10 yrs.    O: Healthy WD,WN female Affect: normal   A: FMH: OV and pancreatic cancer  Need for genetic counseling    P:  Referral is made for genetic counseling  Information is given for BRCA and OV cancer   RV  

## 2015-08-07 NOTE — Patient Instructions (Signed)
Genetic Counseling WHAT IS GENETIC COUNSELING?  Genetic counseling is a meeting with a trained health professional (geneticist or Dietitian) about:   Genetic or chromosomal disorders.  Your risks for health conditions that are passed down through families (inherited). This information is kept private and confidential. Genetic counseling can:  Explain the diagnosis of a condition.  Tell you about your risk of developing certain conditions, such as cancer.  Help you understand patterns of disease in your family.  Help you find resources for coping with a diagnosis.  Help you plan for your next steps. This may include referrals to specialists. WHO DOES GENETIC COUNSELING?  People who do genetic counseling are health professionals, including:   Medical doctors.  Nurses.  Social workers.  Researchers with training in genetic conditions. Some genetic counselors specialize in specific conditions, age groups, or groups of people. Sometimes counselors will also refer you to a physician who specializes in a particular condition.  WHO SHOULD GET GENETIC COUNSELING?  You may benefit from genetic counseling if:  You have a condition that is:  Genetic.  Chromosomal.  Inherited.  You have a child with a genetic or chromosomal condition.  You want to know more about your risk for inherited conditions. These include:  Heart disease.  Cancer.  Blood disorders.  Mental illness.  You want to know how your genetic risk might be affected by lifestyle choices.  You are worried about passing on an inherited condition to your child.  You want to understand or respond to the results of genetic testing.  You have had two or more miscarriages or stillbirths and you want to become pregnant.  You are pregnant and you are 8 years of age or older. This is when the risk of genetic abnormalities increases.  You want to know more about the risk of conditions that are common among  your ethnic group. HOW CAN I FIND A GENETIC COUNSELOR?   Ask your health care provider to suggest a genetic counselor in your area.  Search online on the website of the [employer]. The website is: LocalLamps.nl HOW SHOULD I PREPARE FOR MY GENETIC COUNSELING SESSION?  Find out if counseling and testing are covered under your health insurance plan.  Make sure you have all recommended screenings or tests.  Gather your personal medical records.  Bring information about your family's medical history.  Write down any questions you may have. WHAT CAN I EXPECT DURING MY GENETIC COUNSELING SESSION? Plan to talk about:  Your family and personal medical history.  Possible patterns of inherited conditions.  Genetic testing options or the results of genetic tests.  The results of tests for genetic conditions.  The meaning of a diagnosis, if your health care provider says it is genetic, inherited, or chromosomal.  Strategies for preventing, identifying, or managing genetic conditions.  Resources for further information, support, or care. WHAT HAPPENS AFTER GENETIC COUNSELING?   You should receive a letter summarizing the information you discussed with your genetic counselor.  You may want to follow up with specialists or other resources for support and information.  You can talk to your genetic counselor again for more information or support.   This information is not intended to replace advice given to you by your health care provider. Make sure you discuss any questions you have with your health care provider.   Document Released: 05/25/2002 Document Revised: 03/25/2014 Document Reviewed: 11/10/2013 Elsevier Interactive Patient Education [employer].

## 2015-08-14 NOTE — Progress Notes (Signed)
Encounter reviewed by Dr. Brook Amundson C. Silva.  

## 2015-08-16 ENCOUNTER — Encounter: Payer: Self-pay | Admitting: Genetic Counselor

## 2015-08-17 ENCOUNTER — Encounter: Payer: Self-pay | Admitting: Genetic Counselor

## 2015-08-23 ENCOUNTER — Telehealth: Payer: Self-pay | Admitting: Nurse Practitioner

## 2015-08-23 NOTE — Telephone Encounter (Signed)
Left voicemail regarding referral appointment. The information is listed below. Should the patient need to cancel or reschedule this appointment, please advise them to call the office they've been referred to in order to reschedule.  Genetic Counseling at [facility] [place]           **located directly next to [phone]  Please arrive 30 minutes early with insurance card and photo id.

## 2015-09-27 ENCOUNTER — Encounter: Payer: Self-pay | Admitting: Genetic Counselor

## 2015-09-27 ENCOUNTER — Ambulatory Visit (HOSPITAL_BASED_OUTPATIENT_CLINIC_OR_DEPARTMENT_OTHER): Payer: BLUE CROSS/BLUE SHIELD | Admitting: Genetic Counselor

## 2015-09-27 ENCOUNTER — Other Ambulatory Visit: Payer: BLUE CROSS/BLUE SHIELD

## 2015-09-27 DIAGNOSIS — Z315 Encounter for genetic counseling: Secondary | ICD-10-CM

## 2015-09-27 DIAGNOSIS — Z800 Family history of malignant neoplasm of digestive organs: Secondary | ICD-10-CM | POA: Diagnosis not present

## 2015-09-27 DIAGNOSIS — Z8041 Family history of malignant neoplasm of ovary: Secondary | ICD-10-CM

## 2015-09-27 DIAGNOSIS — Z803 Family history of malignant neoplasm of breast: Secondary | ICD-10-CM

## 2015-09-27 NOTE — Progress Notes (Signed)
REFERRING PROVIDER: No referring provider defined for this encounter.  PRIMARY PROVIDER:  No PCP Per Patient  PRIMARY REASON FOR VISIT:  1. Family history of breast cancer      HISTORY OF PRESENT ILLNESS:   Ms. Bribiesca, a 65 y.o. female, was seen for a [place] cancer genetics consultation at the request of Dr. No ref. provider found due to a family history of cancer.  Ms. Eppard presents to clinic today to discuss the possibility of a hereditary predisposition to cancer, genetic testing, and to further clarify her future cancer risks, as well as potential cancer risks for family members.    Ms. Depass is a 65 y.o. female with no personal history of cancer.    CANCER HISTORY:   No history exists.     HORMONAL RISK FACTORS:  Menarche was at age 77.  First live birth at age N/A.  OCP use for approximately 21 years.  Ovaries intact: yes.  Hysterectomy: no.  Menopausal status: postmenopausal.  HRT use: 0 years. Colonoscopy: yes; normal. Mammogram within the last year: yes. Number of breast biopsies: 0. Up to date with pelvic exams:  yes. 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 Depression   . IBS (irritable bowel syndrome)   . Abnormal Pap smear 2006    LGSIL, pos HR HPV  . Osteoporosis 2008    Prolia since 04/2011 given by Ortho  . Family history of breast cancer   . Family history of pancreatic cancer   . Family history of ovarian cancer     sister dx at 22yr  . Family history of breast cancer   . Family history of pancreatic cancer   . Family history of ovarian cancer     Past Surgical History  Procedure Laterality Date  . Colposcopy  2006    CIN I, managed conservatively  . Umbilical hernia repair  1958  . Forearm fracture surgery Right 1967    rods placed, surgically removed later  . Nasal sinus surgery  1996    deviated septum  .  Clavicle surgery  11/06    after fracture, placed screw and later removed.    Social History   Social History  . Marital Status: Single    Spouse Name: N/A  . Number of Children: 0  . Years of Education: N/A   Occupational History  . FLIGHT ATTENDANT    Social History Main Topics  . Smoking status: Never Smoker   . Smokeless tobacco: Never Used  . Alcohol Use: No  . Drug Use: No  . Sexual Activity: Not Currently    Birth Control/ Protection: Post-menopausal   Other Topics Concern  . Not on file   Social History Narrative     FAMILY HISTORY:  We obtained a detailed, 4-generation family history.  Significant diagnoses are listed below: Family History  Problem Relation Age of Onset  . Kidney failure Mother   . Heart attack Mother   . Pancreatic cancer Cousin 53   died within 651 months . Heart disease Father   . Prostate cancer Father 782   d.74  . Thyroid disease Sister   . Osteoporosis Sister   . Ovarian cancer Sister 623 . Breast cancer Paternal AAunt 26   mastectomy  . Pancreatic cancer Paternal Aunt 81    chemo and radiation  .  Breast cancer Paternal Aunt 8  . Prostate cancer Paternal Uncle 63  . Testicular cancer Maternal Grandfather    Ms. Redford has one sister who was diagnosed with Ovarian Cancer at 36 years old. Pt sister is currently 52 and had a pathogenic mutation her the ATM gene. She has one daughter who is 42 years old and is planning to have testing. Patient's mother passed at 50 years of age with no cancer history. Patient's maternal uncle passed at 45 years of age from possible heart complications or suicide and did not have any children. Patient's maternal grandmother passed away at 49 years with no cancer history. Patient's maternal grandfather passed away at 67 and had testicular cancer. No other cancer reported on maternal side. Patient's father passed away at 79 and had prostate cancer diagnosed at 63 years of age. Patient's maternal uncle  passed from prostate cancer at 64 and was diagnosed at 37. Patient's paternal aunt is 78 and was diagnosed with breast cancer at 63 years old and pancreatic cancer at 49 years old. She has a son who passed from pancreatic cancer at 44 that was diagnosed at 80. Patient has another paternal aunt who was diagnosed with breast cancer at 4. She is currently 63 years old. Patient had three other paternal aunts. Two have passed in their 38s and one is currently living at 90. Patient's paternal grandmother passed away at 74 and paternal grandfather at 31 from heart complications. There is no other reported cancer history on the paternal side of the family.   Patient's maternal ancestors are of [place] descent, and paternal ancestors are of European descent. There no reported Ashkenazi Jewish ancestry. There no known consanguinity.  GENETIC COUNSELING ASSESSMENT: CHARIZMA GARDINER is a 65 y.o. female with a family history of pancreatic, breast, ovarian cancer and a familial pathogenic mutation in the ATM gene which is suggestive of increased predisposition to cancer. We, therefore, discussed and recommended the following at today's visit.   DISCUSSION: Ms. Kitts brought in a copy of her sister's genetic testing.  This testing identified a familial mutation in ATM called [id number] through [employer].  We discussed that individuals who test positive for ATM mutations are at increased risk for breast, pancreatic and prostate cancer.  Interestingly, her sister was diagnosed with ovarian cancer and tested positive.  Ovarian cancer is not a typcial cancer for ATM mutations.  We discussed that based on her sister's result, Ms. Przybysz is at 50% risk for testing positive for the ATM mutation.  If she tests positive, then we will bring her back into the office and discuss medical management.  Ms. Polzin was interested in testing for additional genes, similar to her sister's genetic testing.  We reviewed the  characteristics, features and inheritance patterns of hereditary cancer syndromes. We also discussed genetic testing, including the appropriate family members to test, the process of testing, insurance coverage and turn-around-time for results. We discussed the implications of a negative, positive and/or variant of uncertain significant result. We recommended Ms. Kangas pursue genetic testing for the [facility] Breast Cancer panel.   Based on Ms. Better family history of cancer, she meets medical criteria for genetic testing. Despite that she meets criteria, she may still have an out of pocket cost. We discussed that if her out of pocket cost for testing is over $100, the laboratory will call and confirm whether she wants to proceed with testing.  If the out of pocket cost of testing is less than $100  she will be billed by the genetic testing laboratory.   PLAN: After considering the risks, benefits, and limitations, Ms. Odeh  provided informed consent to pursue genetic testing and the blood sample was sent to [facility] for analysis of the Breast cancer panel. Results should be available within approximately 2-3 weeks' time, at which point they will be disclosed by telephone to Ms. Mates, as will any additional recommendations warranted by these results. Ms. Magos will receive a summary of her genetic counseling visit and a copy of her results once available. This information will also be available in Epic. We encouraged Ms. Kavanagh to remain in contact with cancer genetics annually so that we can continuously update the family history and inform her of any changes in cancer genetics and testing that may be of benefit for her family. Ms. Asher questions were answered to her satisfaction today. Our contact information was provided should additional questions or concerns arise.  Lastly, we encouraged Ms. Kutzer to remain in contact with cancer genetics annually so that we can continuously update  the family history and inform her of any changes in cancer genetics and testing that may be of benefit for this family.   Ms.  Quang questions were answered to her satisfaction today. Our contact information was provided should additional questions or concerns arise. Thank you for the referral and allowing [place] to share in the care of your patient.   Karen P. Florene Glen, [place], [facility] Certified Genetic Counselor [email or web address] phone: [phone]  The patient was seen for a total of 45 minutes in face-to-face genetic counseling.  This patient was discussed with Drs. Magrinat, Lindi Adie and/or Burr Medico who agrees with the above.    _______________________________________________________________________ For Office Staff:  Number of people involved in session: 1 Was an Intern/ student involved with case: yes [employer]

## 2015-10-11 DIAGNOSIS — Z1379 Encounter for other screening for genetic and chromosomal anomalies: Secondary | ICD-10-CM

## 2015-10-11 HISTORY — DX: Encounter for other screening for genetic and chromosomal anomalies: Z13.79

## 2015-10-12 ENCOUNTER — Telehealth: Payer: Self-pay | Admitting: Genetic Counselor

## 2015-10-12 ENCOUNTER — Ambulatory Visit: Payer: Self-pay | Admitting: Genetic Counselor

## 2015-10-12 ENCOUNTER — Encounter: Payer: Self-pay | Admitting: Genetic Counselor

## 2015-10-12 DIAGNOSIS — Z800 Family history of malignant neoplasm of digestive organs: Secondary | ICD-10-CM

## 2015-10-12 DIAGNOSIS — Z1379 Encounter for other screening for genetic and chromosomal anomalies: Secondary | ICD-10-CM

## 2015-10-12 DIAGNOSIS — Z8041 Family history of malignant neoplasm of ovary: Secondary | ICD-10-CM

## 2015-10-12 DIAGNOSIS — Z803 Family history of malignant neoplasm of breast: Secondary | ICD-10-CM

## 2015-10-12 NOTE — Telephone Encounter (Signed)
Revealed that genetic testing was negative for 26 genes on the breast cancer panel, including the ATM gene.  Patient's sister had a pathogenic change in the ATM gene.  This was not identified in the patient.  She voiced her understanding.

## 2015-10-12 NOTE — Telephone Encounter (Signed)
LM on VM with good news.  Asked that she CB. 

## 2015-10-12 NOTE — Progress Notes (Signed)
HPI: Ms. Escareno was previously seen in the [place] clinic due to a family history of cancer, a known family mutation in ATM, and concerns regarding a hereditary predisposition to cancer. Please refer to our prior cancer genetics clinic note for more information regarding Ms. Rengel medical, social and family histories, and our assessment and recommendations, at the time. Ms. Caba recent genetic test results were disclosed to her, as were recommendations warranted by these results. These results and recommendations are discussed in more detail below.  FAMILY HISTORY:  We obtained a detailed, 4-generation family history.  Significant diagnoses are listed below: Family History  Problem Relation Age of Onset  . Kidney failure Mother   . Heart attack Mother   . Pancreatic cancer Cousin 27    died within 49 months  . Heart disease Father   . Prostate cancer Father 62    d.74  . Thyroid disease Sister   . Osteoporosis Sister   . Ovarian cancer Sister 68  . Breast cancer Paternal Aunt 26    mastectomy  . Pancreatic cancer Paternal Aunt 81    chemo and radiation  . Breast cancer Paternal Aunt 59  . Prostate cancer Paternal Uncle 78  . Testicular cancer Maternal Grandfather     Ms. Soldo has one sister who was diagnosed with Ovarian Cancer at 4 years old. Pt sister is currently 26 and had a pathogenic mutation her the ATM gene. She has one daughter who is 66 years old and is planning to have testing. Patient's mother passed at 62 years of age with no cancer history. Patient's maternal uncle passed at 20 years of age from possible heart complications or suicide and did not have any children. Patient's maternal grandmother passed away at 78 years with no cancer history. Patient's maternal grandfather passed away at 69 and had testicular cancer. No other cancer reported on maternal side. Patient's father passed away at 53 and had prostate cancer diagnosed at 63 years of age.  Patient's maternal uncle passed from prostate cancer at 75 and was diagnosed at 25. Patient's paternal aunt is 45 and was diagnosed with breast cancer at 63 years old and pancreatic cancer at 1 years old. She has a son who passed from pancreatic cancer at 93 that was diagnosed at 82. Patient has another paternal aunt who was diagnosed with breast cancer at 34. She is currently 63 years old. Patient had three other paternal aunts. Two have passed in their 90s and one is currently living at 1. Patient's paternal grandmother passed away at 25 and paternal grandfather at 42 from heart complications. There is no other reported cancer history on the paternal side of the family.  Patient's maternal ancestors are of [place] descent, and paternal ancestors are of European descent. There no reported Ashkenazi Jewish ancestry. There no known consanguinity.  GENETIC TEST RESULTS:  We recommended Ms. Boldin pursue testing for the familial hereditary cancer gene mutation called ATM, [id number], as well as the breast cancer gene panel through [facility]. Ms. Boyland test was normal and did not reveal the familial mutation. We call this result a true negative result because the cancer-causing mutation was identified in Ms. Mance family, and she did not inherit it.  Given this negative result, Ms. Urias chances of developing ATM-related cancers are the same as they are in the general population.  The test report has been scanned into EPIC and is located under the Molecular Pathology section of the Results Review tab.  We discussed with Ms. Daywalt that since the current genetic testing is not perfect, it is possible there may be a gene mutation in one of these genes that current testing cannot detect, but that chance is small. We also discussed, that it is possible that another gene that has not yet been discovered, or that we have not yet tested, is responsible for the cancer diagnoses in the family, and it is,  therefore, important to remain in touch with cancer genetics in the future so that we can continue to offer Ms. Koy the most up to date genetic testing.   CANCER SCREENING RECOMMENDATIONS: This normal result is reassuring and indicates that Ms. Moskal does not likely have an increased risk of cancer due to a mutation in one of these genes.  We, therefore, recommended  Ms. Cashwell continue to follow the cancer screening guidelines provided by her primary healthcare providers.   RECOMMENDATIONS FOR FAMILY MEMBERS: Women in this family might be at some increased risk of developing cancer, over the general population risk, simply due to the family history of cancer. We recommended women in this family have a yearly mammogram beginning at age 57, or 31 years younger than the earliest onset of cancer, an an annual clinical breast exam, and perform monthly breast self-exams. Women in this family should also have a gynecological exam as recommended by their primary provider. All family members should have a colonoscopy by age 40.  FOLLOW-UP: Lastly, we discussed with Ms. Esteve that cancer genetics is a rapidly advancing field and it is possible that new genetic tests will be appropriate for her and/or her family members in the future. We encouraged her to remain in contact with cancer genetics on an annual basis so we can update her personal and family histories and let her know of advances in cancer genetics that may benefit this family.   Our contact number was provided. Ms. Tadros questions were answered to her satisfaction, and she knows she is welcome to call us at anytime with additional questions or concerns.   Roma Kayser, MS, [facility] Certified Genetic Counselor [email or web address]

## 2015-11-08 ENCOUNTER — Encounter: Payer: Self-pay | Admitting: *Deleted

## 2015-12-13 ENCOUNTER — Other Ambulatory Visit: Payer: Self-pay | Admitting: Nurse Practitioner

## 2015-12-13 DIAGNOSIS — Z1231 Encounter for screening mammogram for malignant neoplasm of breast: Secondary | ICD-10-CM

## 2016-01-04 ENCOUNTER — Ambulatory Visit
Admission: RE | Admit: 2016-01-04 | Discharge: 2016-01-04 | Disposition: A | Discharge status: Home / Self Care | Payer: BLUE CROSS/BLUE SHIELD | Source: Ambulatory Visit | Attending: Nurse Practitioner | Admitting: Nurse Practitioner

## 2016-01-04 DIAGNOSIS — Z1231 Encounter for screening mammogram for malignant neoplasm of breast: Secondary | ICD-10-CM

## 2016-01-26 ENCOUNTER — Encounter (HOSPITAL_COMMUNITY): Payer: Self-pay

## 2016-04-05 ENCOUNTER — Ambulatory Visit: Payer: BLUE CROSS/BLUE SHIELD | Admitting: Nurse Practitioner

## 2016-04-08 ENCOUNTER — Encounter: Payer: Self-pay | Admitting: Nurse Practitioner

## 2016-04-08 ENCOUNTER — Ambulatory Visit (INDEPENDENT_AMBULATORY_CARE_PROVIDER_SITE_OTHER): Payer: BLUE CROSS/BLUE SHIELD | Admitting: Nurse Practitioner

## 2016-04-08 VITALS — BP 114/70 | HR 60 | Ht 63.5 in | Wt 146.0 lb

## 2016-04-08 DIAGNOSIS — Z8041 Family history of malignant neoplasm of ovary: Secondary | ICD-10-CM

## 2016-04-08 DIAGNOSIS — Z803 Family history of malignant neoplasm of breast: Secondary | ICD-10-CM | POA: Diagnosis not present

## 2016-04-08 DIAGNOSIS — Z800 Family history of malignant neoplasm of digestive organs: Secondary | ICD-10-CM | POA: Diagnosis not present

## 2016-04-08 DIAGNOSIS — Z0000 Encounter for general adult medical examination without abnormal findings: Secondary | ICD-10-CM

## 2016-04-08 DIAGNOSIS — Z01419 Encounter for gynecological examination (general) (routine) without abnormal findings: Secondary | ICD-10-CM

## 2016-04-08 DIAGNOSIS — N952 Postmenopausal atrophic vaginitis: Secondary | ICD-10-CM

## 2016-04-08 MED ORDER — ESTRADIOL 2 MG VA RING
VAGINAL_RING | VAGINAL | 4 refills | Status: DC
Start: 2016-04-08 — End: 2017-04-10

## 2016-04-08 NOTE — Progress Notes (Signed)
Patient ID: Allison Thompson, female   DOB: 03-04-1951, 66 y.o.   MRN: [id number]  66 y.o. G1P0010 Single  Caucasian Fe here for annual exam.  Doing well without new diagnosis.  Likes using the Estring.  Not SA since death of partner in 2010-06-12.  Still followed by Ortho for Osteopenia.  Last Prolia was in September and due again in 12-Jun-2022.  Genetic testing for [facility] of cancer done 09/27/15 was negative.  Prior use of Forteo X 2 yrs 2006/06/12 2008/06/11.  Now Prolia since then. She did go to [place] last year for 2 weeks (home of mother who died several yrs ago).  Likes retirement.  Patient's last menstrual period was 08/16/2004 (approximate).          Sexually active: No.  The current method of family planning is none.    Exercising: Yes.    Gym/ health club routine includes [employer] five times per week. Smoker:  no  Health Maintenance: Pap: 04/03/15, Negative with neg HR HPV, History of CIN I 01/28/2005 with Colpo biopsy benign  MMG: 01/04/16, Bi-Rads 1: Negative Colonoscopy: 05/16/15, Normal, repeat in 10 years BMD: 05/27/13  T score spine -0.9, right hip neck -1.7; left hip neck -1.7 TDaP: 06/17/10 Shingles: 08/17/15 Pneumonia: No Hep C and HIV: 04/03/15 Labs: PCP takes care of all labs (Dr. Virgina Jock)   reports that she has never smoked. She has never used smokeless tobacco. She reports that she does not drink alcohol or use drugs.  Past Medical History:  Diagnosis Date  . Abnormal Pap smear Jun 11, 2004   LGSIL, pos HR HPV  . Depression   . Family history of breast cancer   . Family history of breast cancer   . Family history of ovarian cancer    sister dx at 19yr  . Family history of ovarian cancer   . Family history of pancreatic cancer   . Family history of pancreatic cancer   . Genetic testing 10/11/2015   Negative genetic testing on the [facility] Breast cancer panel.  The Breast cancer gene panel offered by [facility] includes sequencing and rearrangement analysis for the following 26 genes:  AKT1, ATM, BARD1,  BRCA1, BRCA2, BRIP1, CDH1, CHEK2, FAM175A, FANCC, MRE11, MUTYH, NBN, NF1, PALB2, PIK3CA, PTEN, RAD50, RAD51C, RAD51D, RINT1, SDHB, SDHD, STK11, TP53, and XRCC2.   The report date is October 11, 2015.  . IBS (irritable bowel syndrome)   . Osteoporosis 203/27/08  Prolia since 04/2011 given by Ortho    Past Surgical History:  Procedure Laterality Date  . CLAVICLE SURGERY  11/06   after fracture, placed screw and later removed.  . COLPOSCOPY  2Mar 27, 2006  CIN I, managed conservatively  . FOREARM FRACTURE SURGERY Right 1967   rods placed, surgically removed later  . NASAL SINUS SURGERY  1996   deviated septum  . UMBILICAL HERNIA REPAIR  1958    Current Outpatient Prescriptions  Medication Sig Dispense Refill  . Calcium Carbonate-Vitamin D (CALCIUM-D) 600-400 MG-UNIT TABS Take 1 tablet by mouth daily.    . cholecalciferol (VITAMIN D) 1000 UNITS tablet Take 1,000 Units by mouth daily.    .Marland Kitchendenosumab (PROLIA) 60 MG/ML SOLN injection Inject 60 mg into the skin every 6 (six) months. Administer in upper arm, thigh, or abdomen    . EPIPEN 2-PAK 0.3 MG/0.3ML SOAJ injection See admin instructions.  1  . estradiol (ESTRING) 2 MG vaginal ring INSERT 1 RING VAGINALLY ONCE EVERY 3 MONTHS 1 each 4  . FLUoxetine (PROZAC)  20 MG capsule Take 60 mg by mouth daily.    . Glucosamine 500 MG CAPS Take 1 capsule by mouth daily.    . Omega-3 Fatty Acids (FISH OIL PO) Take by mouth.    . zolpidem (AMBIEN) 10 MG tablet Take 5 mg by mouth at bedtime as needed.      No current facility-administered medications for this visit.     Family History  Problem Relation Age of Onset  . Kidney failure Mother   . Heart attack Mother   . Pancreatic cancer Cousin 73    died within 53 months  . Heart disease Father   . Prostate cancer Father 13    d.74  . Thyroid disease Sister   . Osteoporosis Sister   . Ovarian cancer Sister 12  . Breast cancer Paternal Aunt 85    mastectomy  . Pancreatic cancer Paternal Aunt 81    chemo and  radiation  . Breast cancer Paternal Aunt 70  . Prostate cancer Paternal Uncle 58  . Testicular cancer Maternal Grandfather     ROS:  Pertinent items are noted in HPI.  Otherwise, a comprehensive ROS was negative.  Exam:   BP 114/70 (BP Location: Right Arm, Patient Position: Sitting, Cuff Size: Normal)   Pulse 60   Ht 5' 3.5" (1.613 m)   Wt 146 lb (66.2 kg)   LMP 08/16/2004 (Approximate)   BMI 25.46 kg/m  Height: 5' 3.5" (161.3 cm) Ht Readings from Last 3 Encounters:  04/08/16 5' 3.5" (1.613 m)  08/07/15 5' 3.75" (1.619 m)  04/03/15 5' 3.75" (1.619 m)    General appearance: alert, cooperative and appears stated age Head: Normocephalic, without obvious abnormality, atraumatic Neck: no adenopathy, supple, symmetrical, trachea midline and thyroid normal to inspection and palpation Lungs: clear to auscultation bilaterally Breasts: normal appearance, no masses or tenderness Heart: regular rate and rhythm Abdomen: soft, non-tender; no masses,  no organomegaly Extremities: extremities normal, atraumatic, no cyanosis or edema Skin: Skin color, texture, turgor normal. No rashes or lesions Lymph nodes: Cervical, supraclavicular, and axillary nodes normal. No abnormal inguinal nodes palpated Neurologic: Grossly normal   Pelvic: External genitalia:  no lesions              Urethra:  normal appearing urethra with no masses, tenderness or lesions              Bartholin's and Skene's: normal                 Vagina: normal appearing vagina with normal color and discharge, no lesions.  Estring in place              Cervix: anteverted              Pap taken: No. Bimanual Exam:  Uterus:  normal size, contour, position, consistency, mobility, non-tender              Adnexa: no mass, fullness, tenderness               Rectovaginal: Confirms               Anus:  normal sphincter tone, no lesions  Chaperone present: yes  A:  Well Woman with normal exam  Postmenopausal Atrophic  vaginitis treated with Estring Osteoporosis treated with Prolia since 04/2011 X 3 years Previously treated with Forteo 03/29/2009 X 2 years History of depression - death of partner 10-01-2010 History of Vulvar Vestibulitis - off Amytriptaline since not SA  P:   Reviewed health and wellness pertinent to exam  Pap smear as above  Mammogram is due 12/2016  Will get ROI for BMD done 2017  Refill on Estring for a year along with a coupon  Counseled with risk of DVT, CVA, cancer, etc  Counseled on breast self exam, mammography screening, use and side effects of HRT, adequate intake of calcium and vitamin D, diet and exercise, Kegel's exercises return annually or prn  An After Visit Summary was printed and given to the patient.

## 2016-04-08 NOTE — Patient Instructions (Signed)

## 2016-04-14 NOTE — Progress Notes (Signed)
Encounter reviewed by Dr. Brook Amundson C. Silva.  

## 2016-10-21 ENCOUNTER — Telehealth: Payer: Self-pay | Admitting: Obstetrics and Gynecology

## 2016-10-21 NOTE — Telephone Encounter (Signed)
Left message regarding upcoming appointment has been canceled and needs to be rescheduled. °

## 2017-01-09 ENCOUNTER — Other Ambulatory Visit: Payer: Self-pay | Admitting: Certified Nurse Midwife

## 2017-01-09 DIAGNOSIS — Z1231 Encounter for screening mammogram for malignant neoplasm of breast: Secondary | ICD-10-CM

## 2017-01-31 ENCOUNTER — Ambulatory Visit
Admission: RE | Admit: 2017-01-31 | Discharge: 2017-01-31 | Disposition: A | Discharge status: Home / Self Care | Payer: BLUE CROSS/BLUE SHIELD | Source: Ambulatory Visit | Attending: Certified Nurse Midwife | Admitting: Certified Nurse Midwife

## 2017-01-31 DIAGNOSIS — Z1231 Encounter for screening mammogram for malignant neoplasm of breast: Secondary | ICD-10-CM

## 2017-04-09 ENCOUNTER — Ambulatory Visit: Payer: BLUE CROSS/BLUE SHIELD | Admitting: Nurse Practitioner

## 2017-04-10 ENCOUNTER — Other Ambulatory Visit: Payer: Self-pay

## 2017-04-10 ENCOUNTER — Ambulatory Visit: Payer: BLUE CROSS/BLUE SHIELD | Admitting: Certified Nurse Midwife

## 2017-04-10 ENCOUNTER — Encounter: Payer: Self-pay | Admitting: Certified Nurse Midwife

## 2017-04-10 ENCOUNTER — Other Ambulatory Visit (HOSPITAL_COMMUNITY)
Admission: RE | Admit: 2017-04-10 | Discharge: 2017-04-10 | Disposition: A | Discharge status: Home / Self Care | Payer: BLUE CROSS/BLUE SHIELD | Source: Ambulatory Visit | Attending: Certified Nurse Midwife | Admitting: Certified Nurse Midwife

## 2017-04-10 VITALS — BP 116/58 | HR 84 | Resp 14 | Ht 63.5 in | Wt 143.4 lb

## 2017-04-10 DIAGNOSIS — Z124 Encounter for screening for malignant neoplasm of cervix: Secondary | ICD-10-CM | POA: Insufficient documentation

## 2017-04-10 DIAGNOSIS — Z01419 Encounter for gynecological examination (general) (routine) without abnormal findings: Secondary | ICD-10-CM | POA: Diagnosis not present

## 2017-04-10 DIAGNOSIS — N951 Menopausal and female climacteric states: Secondary | ICD-10-CM | POA: Diagnosis not present

## 2017-04-10 NOTE — Patient Instructions (Signed)

## 2017-04-10 NOTE — Progress Notes (Signed)
67 y.o. G1P0010 Single  Caucasian Fe here for annual exam. Menopausal no HRT, denies vaginal bleeding or vaginal dryness. Has been exercises more with with upper body. Has BMD due for this year after Prolia use for several years, with Dr. Layne Benton management. Psychiatric manages Prozac, and insomnia medications. Patient has decided to stop Estring, not sexually active and does not feel she needs now. No health issues today.  Patient's last menstrual period was 08/16/2004 (approximate).          Sexually active: No.  The current method of family planning is post menopausal status.    Exercising: Yes.    weights and cardio  Smoker:  no  Health Maintenance: Pap:  04-03-15 neg HPV HR neg History of Abnormal Pap: yes MMG:  01-31-17 category b density birads 1:neg Self Breast exams: occ Colonoscopy:  05/16/15, Normal, repeat in 10 years BMD:   2017 with MD TDaP:  2012 Shingles: 2017 Pneumonia: no  Hep C and HIV: both neg 2017 Labs: PCP   reports that  has never smoked. she has never used smokeless tobacco. She reports that she does not drink alcohol or use drugs.  Past Medical History:  Diagnosis Date  . Abnormal Pap smear 2006   LGSIL, pos HR HPV  . Depression   . Family history of breast cancer   . Family history of breast cancer   . Family history of ovarian cancer    sister dx at 31yr  . Family history of ovarian cancer   . Family history of pancreatic cancer   . Family history of pancreatic cancer   . Genetic testing 10/11/2015   Negative genetic testing on the [facility] Breast cancer panel.  The Breast cancer gene panel offered by [facility] includes sequencing and rearrangement analysis for the following 26 genes:  AKT1, ATM, BARD1, BRCA1, BRCA2, BRIP1, CDH1, CHEK2, FAM175A, FANCC, MRE11, MUTYH, NBN, NF1, PALB2, PIK3CA, PTEN, RAD50, RAD51C, RAD51D, RINT1, SDHB, SDHD, STK11, TP53, and XRCC2.   The report date is October 11, 2015.  . IBS (irritable bowel syndrome)   . Osteoporosis 2008    Prolia since 04/2011 given by Ortho    Past Surgical History:  Procedure Laterality Date  . CLAVICLE SURGERY  11/06   after fracture, placed screw and later removed.  . COLPOSCOPY  2006   CIN I, managed conservatively  . FOREARM FRACTURE SURGERY Right 1967   rods placed, surgically removed later  . NASAL SINUS SURGERY  1996   deviated septum  . UMBILICAL HERNIA REPAIR  1958    Current Outpatient Medications  Medication Sig Dispense Refill  . Calcium Carbonate-Vitamin D (CALCIUM-D) 600-400 MG-UNIT TABS Take 1 tablet by mouth daily.    . cholecalciferol (VITAMIN D) 1000 UNITS tablet Take 1,000 Units by mouth daily.    .Marland Kitchendenosumab (PROLIA) 60 MG/ML SOLN injection Inject 60 mg into the skin every 6 (six) months. Administer in upper arm, thigh, or abdomen    . EPIPEN 2-PAK 0.3 MG/0.3ML SOAJ injection See admin instructions.  1  . estradiol (ESTRING) 2 MG vaginal ring INSERT 1 RING VAGINALLY ONCE EVERY 3 MONTHS 1 each 4  . FLUoxetine (PROZAC) 20 MG capsule Take 60 mg by mouth daily.    . Glucosamine 500 MG CAPS Take 1 capsule by mouth daily.    . Omega-3 Fatty Acids (FISH OIL PO) Take by mouth.    . zolpidem (AMBIEN) 10 MG tablet Take 5 mg by mouth at bedtime as needed.  No current facility-administered medications for this visit.     Family History  Problem Relation Age of Onset  . Kidney failure Mother   . Heart attack Mother   . Pancreatic cancer Cousin 95       died within 76 months  . Heart disease Father   . Prostate cancer Father 30       d.74  . Thyroid disease Sister   . Osteoporosis Sister   . Ovarian cancer Sister 91  . Breast cancer Paternal Aunt 72       mastectomy  . Pancreatic cancer Paternal Aunt 81       chemo and radiation  . Breast cancer Paternal Aunt 9  . Prostate cancer Paternal Uncle 21  . Testicular cancer Maternal Grandfather     ROS:  Pertinent items are noted in HPI.  Otherwise, a comprehensive ROS was negative.  Exam:   BP (!) 116/58 (BP  Location: Right Arm, Patient Position: Sitting, Cuff Size: Normal)   Pulse 84   Resp 14   Ht 5' 3.5" (1.613 m)   Wt 143 lb 6.4 oz (65 kg)   LMP 08/16/2004 (Approximate)   BMI 25.00 kg/m  Height: 5' 3.5" (161.3 cm) Ht Readings from Last 3 Encounters:  04/10/17 5' 3.5" (1.613 m)  04/08/16 5' 3.5" (1.613 m)  08/07/15 5' 3.75" (1.619 m)    General appearance: alert, cooperative and appears stated age Head: Normocephalic, without obvious abnormality, atraumatic Neck: no adenopathy, supple, symmetrical, trachea midline and thyroid normal to inspection and palpation Lungs: clear to auscultation bilaterally Breasts: normal appearance, no masses or tenderness, No nipple retraction or dimpling, No nipple discharge or bleeding, No axillary or supraclavicular adenopathy Heart: regular rate and rhythm Abdomen: soft, non-tender; no masses,  no organomegaly Extremities: extremities normal, atraumatic, no cyanosis or edema Skin: Skin color, texture, turgor normal. No rashes or lesions Lymph nodes: Cervical, supraclavicular, and axillary nodes normal. No abnormal inguinal nodes palpated Neurologic: Grossly normal   Pelvic: External genitalia:  no lesions              Urethra:  normal appearing urethra with no masses, tenderness or lesions              Bartholin's and Skene's: normal                 Vagina: normal appearing vagina with normal color and discharge, no lesions              Cervix: no cervical motion tenderness, no lesions and nulliparous appearance, tiny cervical polyp noted,bleeding with pap and polyp not visualized after pap              Pap taken: Yes.   Bimanual Exam:  Uterus:  normal size, contour, position, consistency, mobility, non-tender and anteverted              Adnexa: normal adnexa and no mass, fullness, tenderness               Rectovaginal: Confirms               Anus:  normal sphincter tone, no lesions  Chaperone present: yes  A:  Well Woman with normal  exam  Menopausal no HRT  Tiny cervical polyp  Vaginal dryness with Estring use, plans to stop  Osteoporosis with Endocrine management  MD management of anxiety/depression  Strong family history of breast and ovarian cancer, had genetic testing not a carrier of gene  P:  Reviewed health and wellness pertinent to exam  Aware of need to evaluate if vaginal bleeding  Discussed benign finding and removal not recommended, discussed not seen after pap taken, will check at next aex.  Discussed possible dryness with stopping Estring, but can use OTC products. Has used coconut oil and will try if needed. Instructions given.  Continue follow up as indicated with PCP.  Stressed SBE and mammogram yearly.  Pap smear: yes   counseled on breast self exam, mammography screening, feminine hygiene, adequate intake of calcium and vitamin D, diet and exercise  return annually or prn  An After Visit Summary was printed and given to the patient.

## 2017-04-15 LAB — CYTOLOGY - PAP
Diagnosis: NEGATIVE
HPV: NOT DETECTED

## 2017-11-26 DIAGNOSIS — F331 Major depressive disorder, recurrent, moderate: Secondary | ICD-10-CM | POA: Diagnosis not present

## 2017-11-26 DIAGNOSIS — G4700 Insomnia, unspecified: Secondary | ICD-10-CM | POA: Diagnosis not present

## 2017-12-03 DIAGNOSIS — M47816 Spondylosis without myelopathy or radiculopathy, lumbar region: Secondary | ICD-10-CM | POA: Diagnosis not present

## 2017-12-03 DIAGNOSIS — R5383 Other fatigue: Secondary | ICD-10-CM | POA: Diagnosis not present

## 2017-12-03 DIAGNOSIS — M7062 Trochanteric bursitis, left hip: Secondary | ICD-10-CM | POA: Diagnosis not present

## 2017-12-03 DIAGNOSIS — M7061 Trochanteric bursitis, right hip: Secondary | ICD-10-CM | POA: Diagnosis not present

## 2017-12-03 DIAGNOSIS — E559 Vitamin D deficiency, unspecified: Secondary | ICD-10-CM | POA: Diagnosis not present

## 2017-12-03 DIAGNOSIS — M810 Age-related osteoporosis without current pathological fracture: Secondary | ICD-10-CM | POA: Diagnosis not present

## 2017-12-16 DIAGNOSIS — M25551 Pain in right hip: Secondary | ICD-10-CM | POA: Diagnosis not present

## 2017-12-16 DIAGNOSIS — M7062 Trochanteric bursitis, left hip: Secondary | ICD-10-CM | POA: Diagnosis not present

## 2017-12-16 DIAGNOSIS — M25552 Pain in left hip: Secondary | ICD-10-CM | POA: Diagnosis not present

## 2017-12-16 DIAGNOSIS — M7061 Trochanteric bursitis, right hip: Secondary | ICD-10-CM | POA: Diagnosis not present

## 2017-12-23 DIAGNOSIS — M7061 Trochanteric bursitis, right hip: Secondary | ICD-10-CM | POA: Diagnosis not present

## 2017-12-23 DIAGNOSIS — M25551 Pain in right hip: Secondary | ICD-10-CM | POA: Diagnosis not present

## 2017-12-23 DIAGNOSIS — M25552 Pain in left hip: Secondary | ICD-10-CM | POA: Diagnosis not present

## 2017-12-23 DIAGNOSIS — M7062 Trochanteric bursitis, left hip: Secondary | ICD-10-CM | POA: Diagnosis not present

## 2017-12-30 ENCOUNTER — Other Ambulatory Visit: Payer: Self-pay | Admitting: Certified Nurse Midwife

## 2017-12-30 DIAGNOSIS — M25552 Pain in left hip: Secondary | ICD-10-CM | POA: Diagnosis not present

## 2017-12-30 DIAGNOSIS — M7061 Trochanteric bursitis, right hip: Secondary | ICD-10-CM | POA: Diagnosis not present

## 2017-12-30 DIAGNOSIS — M25551 Pain in right hip: Secondary | ICD-10-CM | POA: Diagnosis not present

## 2017-12-30 DIAGNOSIS — Z1231 Encounter for screening mammogram for malignant neoplasm of breast: Secondary | ICD-10-CM

## 2017-12-30 DIAGNOSIS — M7062 Trochanteric bursitis, left hip: Secondary | ICD-10-CM | POA: Diagnosis not present

## 2018-01-06 DIAGNOSIS — M25552 Pain in left hip: Secondary | ICD-10-CM | POA: Diagnosis not present

## 2018-01-06 DIAGNOSIS — M7061 Trochanteric bursitis, right hip: Secondary | ICD-10-CM | POA: Diagnosis not present

## 2018-01-06 DIAGNOSIS — M25551 Pain in right hip: Secondary | ICD-10-CM | POA: Diagnosis not present

## 2018-01-06 DIAGNOSIS — M7062 Trochanteric bursitis, left hip: Secondary | ICD-10-CM | POA: Diagnosis not present

## 2018-01-10 DIAGNOSIS — Z23 Encounter for immunization: Secondary | ICD-10-CM | POA: Diagnosis not present

## 2018-01-13 DIAGNOSIS — M25552 Pain in left hip: Secondary | ICD-10-CM | POA: Diagnosis not present

## 2018-01-13 DIAGNOSIS — M7062 Trochanteric bursitis, left hip: Secondary | ICD-10-CM | POA: Diagnosis not present

## 2018-01-13 DIAGNOSIS — M25551 Pain in right hip: Secondary | ICD-10-CM | POA: Diagnosis not present

## 2018-01-13 DIAGNOSIS — M7061 Trochanteric bursitis, right hip: Secondary | ICD-10-CM | POA: Diagnosis not present

## 2018-01-20 DIAGNOSIS — M7061 Trochanteric bursitis, right hip: Secondary | ICD-10-CM | POA: Diagnosis not present

## 2018-01-20 DIAGNOSIS — M25552 Pain in left hip: Secondary | ICD-10-CM | POA: Diagnosis not present

## 2018-01-20 DIAGNOSIS — M25551 Pain in right hip: Secondary | ICD-10-CM | POA: Diagnosis not present

## 2018-01-20 DIAGNOSIS — M7062 Trochanteric bursitis, left hip: Secondary | ICD-10-CM | POA: Diagnosis not present

## 2018-01-27 DIAGNOSIS — M7062 Trochanteric bursitis, left hip: Secondary | ICD-10-CM | POA: Diagnosis not present

## 2018-01-27 DIAGNOSIS — M25551 Pain in right hip: Secondary | ICD-10-CM | POA: Diagnosis not present

## 2018-01-27 DIAGNOSIS — M7061 Trochanteric bursitis, right hip: Secondary | ICD-10-CM | POA: Diagnosis not present

## 2018-01-27 DIAGNOSIS — M25552 Pain in left hip: Secondary | ICD-10-CM | POA: Diagnosis not present

## 2018-01-28 DIAGNOSIS — M810 Age-related osteoporosis without current pathological fracture: Secondary | ICD-10-CM | POA: Diagnosis not present

## 2018-01-28 DIAGNOSIS — M47816 Spondylosis without myelopathy or radiculopathy, lumbar region: Secondary | ICD-10-CM | POA: Diagnosis not present

## 2018-02-03 ENCOUNTER — Ambulatory Visit
Admission: RE | Admit: 2018-02-03 | Discharge: 2018-02-03 | Disposition: A | Discharge status: Home / Self Care | Payer: Medicare Other | Source: Ambulatory Visit | Attending: Certified Nurse Midwife | Admitting: Certified Nurse Midwife

## 2018-02-03 DIAGNOSIS — M7062 Trochanteric bursitis, left hip: Secondary | ICD-10-CM | POA: Diagnosis not present

## 2018-02-03 DIAGNOSIS — Z1231 Encounter for screening mammogram for malignant neoplasm of breast: Secondary | ICD-10-CM | POA: Diagnosis not present

## 2018-02-03 DIAGNOSIS — M7061 Trochanteric bursitis, right hip: Secondary | ICD-10-CM | POA: Diagnosis not present

## 2018-02-03 DIAGNOSIS — M25551 Pain in right hip: Secondary | ICD-10-CM | POA: Diagnosis not present

## 2018-02-03 DIAGNOSIS — M25552 Pain in left hip: Secondary | ICD-10-CM | POA: Diagnosis not present

## 2018-02-06 DIAGNOSIS — H43813 Vitreous degeneration, bilateral: Secondary | ICD-10-CM | POA: Diagnosis not present

## 2018-02-06 DIAGNOSIS — H25813 Combined forms of age-related cataract, bilateral: Secondary | ICD-10-CM | POA: Diagnosis not present

## 2018-02-10 DIAGNOSIS — M7062 Trochanteric bursitis, left hip: Secondary | ICD-10-CM | POA: Diagnosis not present

## 2018-02-10 DIAGNOSIS — M25551 Pain in right hip: Secondary | ICD-10-CM | POA: Diagnosis not present

## 2018-02-10 DIAGNOSIS — M7061 Trochanteric bursitis, right hip: Secondary | ICD-10-CM | POA: Diagnosis not present

## 2018-02-10 DIAGNOSIS — M25552 Pain in left hip: Secondary | ICD-10-CM | POA: Diagnosis not present

## 2018-02-20 DIAGNOSIS — M25552 Pain in left hip: Secondary | ICD-10-CM | POA: Diagnosis not present

## 2018-02-20 DIAGNOSIS — M25551 Pain in right hip: Secondary | ICD-10-CM | POA: Diagnosis not present

## 2018-02-20 DIAGNOSIS — M7062 Trochanteric bursitis, left hip: Secondary | ICD-10-CM | POA: Diagnosis not present

## 2018-02-20 DIAGNOSIS — M7061 Trochanteric bursitis, right hip: Secondary | ICD-10-CM | POA: Diagnosis not present

## 2018-02-24 DIAGNOSIS — M25552 Pain in left hip: Secondary | ICD-10-CM | POA: Diagnosis not present

## 2018-02-24 DIAGNOSIS — M7062 Trochanteric bursitis, left hip: Secondary | ICD-10-CM | POA: Diagnosis not present

## 2018-02-24 DIAGNOSIS — M7061 Trochanteric bursitis, right hip: Secondary | ICD-10-CM | POA: Diagnosis not present

## 2018-02-24 DIAGNOSIS — M25551 Pain in right hip: Secondary | ICD-10-CM | POA: Diagnosis not present

## 2018-03-25 DIAGNOSIS — F331 Major depressive disorder, recurrent, moderate: Secondary | ICD-10-CM | POA: Diagnosis not present

## 2018-03-25 DIAGNOSIS — G4700 Insomnia, unspecified: Secondary | ICD-10-CM | POA: Diagnosis not present

## 2018-04-17 ENCOUNTER — Encounter: Payer: Self-pay | Admitting: Certified Nurse Midwife

## 2018-04-17 ENCOUNTER — Other Ambulatory Visit: Payer: Self-pay

## 2018-04-17 ENCOUNTER — Ambulatory Visit (INDEPENDENT_AMBULATORY_CARE_PROVIDER_SITE_OTHER): Payer: Medicare Other | Admitting: Certified Nurse Midwife

## 2018-04-17 VITALS — BP 104/62 | HR 68 | Resp 16 | Ht 63.5 in | Wt 149.0 lb

## 2018-04-17 DIAGNOSIS — Z124 Encounter for screening for malignant neoplasm of cervix: Secondary | ICD-10-CM

## 2018-04-17 DIAGNOSIS — Z01419 Encounter for gynecological examination (general) (routine) without abnormal findings: Secondary | ICD-10-CM

## 2018-04-17 NOTE — Progress Notes (Signed)
68 y.o. G1P0010 Single  Caucasian Fe here for annual exam. Menopausal no symptoms. Still having some vaginal burning, but not sexually active so no issues. Sees Dr. Virgina Jock yearly for aex, labs and medication management of Prozac, Ambien. All stable per patient. Exercising and feels this is helping with weight management. Denies vaginal bleeding. No other health issues. Took cruise during Christmas! May have cataract surgery this year as recommended.  Patient's last menstrual period was 08/16/2004 (approximate).          Sexually active: No.  The current method of family planning is post menopausal status.    Exercising: Yes.    cardio & weights Smoker:  no  Review of Systems  Constitutional: Negative.   HENT: Negative.   Eyes: Negative.   Respiratory: Negative.   Cardiovascular: Negative.   Gastrointestinal: Negative.   Genitourinary: Negative.   Musculoskeletal: Negative.   Skin: Negative.   Neurological: Negative.   Endo/Heme/Allergies: Negative.   Psychiatric/Behavioral: Negative.     Health Maintenance: Pap:  04-03-15 neg HPV HR neg, 04-10-17 neg HPV HR neg History of Abnormal Pap: yes MMG:  02-04-18 category b density birads 1:neg Self Breast exams: occ Colonoscopy:  2017 f/u 66yr BMD:   2019, waiting on fax TDaP:  2012 Shingles: 2017 Pneumonia: had done Hep C and HIV: both neg 2017 Labs: with PCP   reports that she has never smoked. She has never used smokeless tobacco. She reports that she does not drink alcohol or use drugs.  Past Medical History:  Diagnosis Date  . Abnormal Pap smear 2006   LGSIL, pos HR HPV  . Depression   . Family history of breast cancer   . Family history of breast cancer   . Family history of ovarian cancer    sister dx at 683yr . Family history of ovarian cancer   . Family history of pancreatic cancer   . Family history of pancreatic cancer   . Genetic testing 10/11/2015   Negative genetic testing on the [facility] Breast cancer panel.   The Breast cancer gene panel offered by [facility] includes sequencing and rearrangement analysis for the following 26 genes:  AKT1, ATM, BARD1, BRCA1, BRCA2, BRIP1, CDH1, CHEK2, FAM175A, FANCC, MRE11, MUTYH, NBN, NF1, PALB2, PIK3CA, PTEN, RAD50, RAD51C, RAD51D, RINT1, SDHB, SDHD, STK11, TP53, and XRCC2.   The report date is October 11, 2015.  . IBS (irritable bowel syndrome)   . Osteoporosis 2008   Prolia since 04/2011 given by Ortho    Past Surgical History:  Procedure Laterality Date  . CLAVICLE SURGERY  11/06   after fracture, placed screw and later removed.  . COLPOSCOPY  2006   CIN I, managed conservatively  . FOREARM FRACTURE SURGERY Right 1967   rods placed, surgically removed later  . NASAL SINUS SURGERY  1996   deviated septum  . UMBILICAL HERNIA REPAIR  1958    Current Outpatient Medications  Medication Sig Dispense Refill  . Calcium Carbonate-Vitamin D (CALCIUM-D) 600-400 MG-UNIT TABS Take 1 tablet by mouth daily.    . cholecalciferol (VITAMIN D) 1000 UNITS tablet Take 1,000 Units by mouth daily.    . Marland Kitchenenosumab (PROLIA) 60 MG/ML SOLN injection Inject 60 mg into the skin every 6 (six) months. Administer in upper arm, thigh, or abdomen    . FLUoxetine (PROZAC) 20 MG capsule Take 40 mg by mouth daily.     . Glucosamine 500 MG CAPS Take 1 capsule by mouth daily.    . Omega-3 Fatty Acids (  FISH OIL PO) Take by mouth.    . zolpidem (AMBIEN) 10 MG tablet Take 5 mg by mouth at bedtime as needed.      No current facility-administered medications for this visit.     Family History  Problem Relation Age of Onset  . Kidney failure Mother   . Heart attack Mother   . Pancreatic cancer Cousin 10       died within 27 months  . Heart disease Father   . Prostate cancer Father 26       d.74  . Thyroid disease Sister   . Osteoporosis Sister   . Ovarian cancer Sister 85  . Breast cancer Paternal Aunt 32       mastectomy  . Pancreatic cancer Paternal Aunt 81       chemo and radiation  .  Breast cancer Paternal Aunt 83  . Prostate cancer Paternal Uncle 69  . Testicular cancer Maternal Grandfather     ROS:  Pertinent items are noted in HPI.  Otherwise, a comprehensive ROS was negative.  Exam:   BP 104/62   Pulse 68   Resp 16   Ht 5' 3.5" (1.613 m)   Wt 149 lb (67.6 kg)   LMP 08/16/2004 (Approximate)   BMI 25.98 kg/m  Height: 5' 3.5" (161.3 cm) Ht Readings from Last 3 Encounters:  04/17/18 5' 3.5" (1.613 m)  04/10/17 5' 3.5" (1.613 m)  04/08/16 5' 3.5" (1.613 m)    General appearance: alert, cooperative and appears stated age Head: Normocephalic, without obvious abnormality, atraumatic Neck: no adenopathy, supple, symmetrical, trachea midline and thyroid normal to inspection and palpation Lungs: clear to auscultation bilaterally Breasts: normal appearance, no masses or tenderness, No nipple retraction or dimpling, No nipple discharge or bleeding, No axillary or supraclavicular adenopathy Heart: regular rate and rhythm Abdomen: soft, non-tender; no masses,  no organomegaly Extremities: extremities normal, atraumatic, no cyanosis or edema Skin: Skin color, texture, turgor normal. No rashes or lesions Lymph nodes: Cervical, supraclavicular, and axillary nodes normal. No abnormal inguinal nodes palpated Neurologic: Grossly normal   Pelvic: External genitalia:  no lesions              Urethra:  normal appearing urethra with no masses, tenderness or lesions              Bartholin's and Skene's: normal                 Vagina: normal appearing vagina with normal color and discharge, no lesions              Cervix: no cervical motion tenderness, no lesions and normal appearance              Pap taken: No. Bimanual Exam:  Uterus:  normal size, contour, position, consistency, mobility, non-tender and anteverted              Adnexa: normal adnexa and no mass, fullness, tenderness               Rectovaginal: Confirms               Anus:  normal sphincter tone, no  lesions  Chaperone present: yes  A:  Well Woman with normal exam  Post menopausal with atrophic vaginitis no change declines treating due to previous no change  Osteoporosis on Prolia with MD management    P:   Reviewed health and wellness pertinent to exam  Aware to advise if vaginal bleeding  Continue follow  up with MD as indicated  Pap smear: no   counseled on breast self exam, mammography screening, feminine hygiene, osteoporosis, adequate intake of calcium and vitamin D, diet and exercise  return annually or prn  An After Visit Summary was printed and given to the patient.

## 2018-04-17 NOTE — Patient Instructions (Signed)

## 2018-06-17 DIAGNOSIS — F331 Major depressive disorder, recurrent, moderate: Secondary | ICD-10-CM | POA: Diagnosis not present

## 2018-06-17 DIAGNOSIS — G4700 Insomnia, unspecified: Secondary | ICD-10-CM | POA: Diagnosis not present

## 2018-07-10 DIAGNOSIS — R7989 Other specified abnormal findings of blood chemistry: Secondary | ICD-10-CM | POA: Diagnosis not present

## 2018-07-10 DIAGNOSIS — E538 Deficiency of other specified B group vitamins: Secondary | ICD-10-CM | POA: Diagnosis not present

## 2018-07-10 DIAGNOSIS — Z79899 Other long term (current) drug therapy: Secondary | ICD-10-CM | POA: Diagnosis not present

## 2018-07-10 DIAGNOSIS — E559 Vitamin D deficiency, unspecified: Secondary | ICD-10-CM | POA: Diagnosis not present

## 2018-07-15 DIAGNOSIS — R82998 Other abnormal findings in urine: Secondary | ICD-10-CM | POA: Diagnosis not present

## 2018-07-17 DIAGNOSIS — M1990 Unspecified osteoarthritis, unspecified site: Secondary | ICD-10-CM | POA: Diagnosis not present

## 2018-07-17 DIAGNOSIS — D72819 Decreased white blood cell count, unspecified: Secondary | ICD-10-CM | POA: Diagnosis not present

## 2018-07-17 DIAGNOSIS — E538 Deficiency of other specified B group vitamins: Secondary | ICD-10-CM | POA: Diagnosis not present

## 2018-07-17 DIAGNOSIS — M810 Age-related osteoporosis without current pathological fracture: Secondary | ICD-10-CM | POA: Diagnosis not present

## 2018-07-17 DIAGNOSIS — N952 Postmenopausal atrophic vaginitis: Secondary | ICD-10-CM | POA: Diagnosis not present

## 2018-07-17 DIAGNOSIS — F325 Major depressive disorder, single episode, in full remission: Secondary | ICD-10-CM | POA: Diagnosis not present

## 2018-07-17 DIAGNOSIS — G4700 Insomnia, unspecified: Secondary | ICD-10-CM | POA: Diagnosis not present

## 2018-07-17 DIAGNOSIS — Z0000 Encounter for general adult medical examination without abnormal findings: Secondary | ICD-10-CM | POA: Diagnosis not present

## 2018-07-17 DIAGNOSIS — M797 Fibromyalgia: Secondary | ICD-10-CM | POA: Diagnosis not present

## 2018-07-17 DIAGNOSIS — E559 Vitamin D deficiency, unspecified: Secondary | ICD-10-CM | POA: Diagnosis not present

## 2018-08-05 DIAGNOSIS — M810 Age-related osteoporosis without current pathological fracture: Secondary | ICD-10-CM | POA: Diagnosis not present

## 2018-08-05 DIAGNOSIS — M47816 Spondylosis without myelopathy or radiculopathy, lumbar region: Secondary | ICD-10-CM | POA: Diagnosis not present

## 2018-09-09 DIAGNOSIS — H25813 Combined forms of age-related cataract, bilateral: Secondary | ICD-10-CM | POA: Diagnosis not present

## 2018-09-09 DIAGNOSIS — H524 Presbyopia: Secondary | ICD-10-CM | POA: Diagnosis not present

## 2018-09-09 DIAGNOSIS — H52223 Regular astigmatism, bilateral: Secondary | ICD-10-CM | POA: Diagnosis not present

## 2018-09-09 DIAGNOSIS — H5213 Myopia, bilateral: Secondary | ICD-10-CM | POA: Diagnosis not present

## 2018-09-09 DIAGNOSIS — H43813 Vitreous degeneration, bilateral: Secondary | ICD-10-CM | POA: Diagnosis not present

## 2018-10-14 DIAGNOSIS — F331 Major depressive disorder, recurrent, moderate: Secondary | ICD-10-CM | POA: Diagnosis not present

## 2018-10-14 DIAGNOSIS — G4700 Insomnia, unspecified: Secondary | ICD-10-CM | POA: Diagnosis not present

## 2018-11-05 DIAGNOSIS — M25775 Osteophyte, left foot: Secondary | ICD-10-CM | POA: Diagnosis not present

## 2018-11-05 DIAGNOSIS — M25774 Osteophyte, right foot: Secondary | ICD-10-CM | POA: Diagnosis not present

## 2018-11-05 DIAGNOSIS — L600 Ingrowing nail: Secondary | ICD-10-CM | POA: Diagnosis not present

## 2019-01-14 DIAGNOSIS — Z23 Encounter for immunization: Secondary | ICD-10-CM | POA: Diagnosis not present

## 2019-01-21 DIAGNOSIS — F331 Major depressive disorder, recurrent, moderate: Secondary | ICD-10-CM | POA: Diagnosis not present

## 2019-01-21 DIAGNOSIS — G4700 Insomnia, unspecified: Secondary | ICD-10-CM | POA: Diagnosis not present

## 2019-02-02 ENCOUNTER — Other Ambulatory Visit: Payer: Self-pay | Admitting: Certified Nurse Midwife

## 2019-02-02 DIAGNOSIS — Z1231 Encounter for screening mammogram for malignant neoplasm of breast: Secondary | ICD-10-CM

## 2019-02-04 DIAGNOSIS — R5383 Other fatigue: Secondary | ICD-10-CM | POA: Diagnosis not present

## 2019-02-04 DIAGNOSIS — E559 Vitamin D deficiency, unspecified: Secondary | ICD-10-CM | POA: Diagnosis not present

## 2019-02-04 DIAGNOSIS — M810 Age-related osteoporosis without current pathological fracture: Secondary | ICD-10-CM | POA: Diagnosis not present

## 2019-02-17 DIAGNOSIS — M47816 Spondylosis without myelopathy or radiculopathy, lumbar region: Secondary | ICD-10-CM | POA: Diagnosis not present

## 2019-02-17 DIAGNOSIS — M810 Age-related osteoporosis without current pathological fracture: Secondary | ICD-10-CM | POA: Diagnosis not present

## 2019-03-30 ENCOUNTER — Ambulatory Visit
Admission: RE | Admit: 2019-03-30 | Discharge: 2019-03-30 | Disposition: A | Discharge status: Home / Self Care | Payer: Medicare Other | Source: Ambulatory Visit | Attending: Certified Nurse Midwife | Admitting: Certified Nurse Midwife

## 2019-03-30 ENCOUNTER — Other Ambulatory Visit: Payer: Self-pay

## 2019-03-30 DIAGNOSIS — Z1231 Encounter for screening mammogram for malignant neoplasm of breast: Secondary | ICD-10-CM

## 2019-04-20 NOTE — Progress Notes (Deleted)
69 y.o. G1P0010 Single  {Race/ethnicity:17218} Fe here for annual exam.    Patient's last menstrual period was 08/16/2004 (approximate).          Sexually active: {yes no:314532}  The current method of family planning is post menopausal status.    Exercising: {yes no:314532}  {types:19826} Smoker:  {YES NO:22349}  ROS  Health Maintenance: Pap:  04-10-17 neg HPV HR neg, 03-30-2019 category b density birads 1:neg History of Abnormal Pap: yes MMG:  *** Self Breast exams: {YES NO:22349} Colonoscopy:  2017 f/u 105yr BMD:   2019 wating on fax TDaP:  2012 Shingles: 2017 Pneumonia: had done Hep C and HIV: both neg 2017 Labs: ***   reports that she has never smoked. She has never used smokeless tobacco. She reports that she does not drink alcohol or use drugs.  Past Medical History:  Diagnosis Date  . Abnormal Pap smear 2006   LGSIL, pos HR HPV  . Depression   . Family history of breast cancer   . Family history of breast cancer   . Family history of ovarian cancer    sister dx at 647yr . Family history of ovarian cancer   . Family history of pancreatic cancer   . Family history of pancreatic cancer   . Genetic testing 10/11/2015   Negative genetic testing on the [facility] Breast cancer panel.  The Breast cancer gene panel offered by [facility] includes sequencing and rearrangement analysis for the following 26 genes:  AKT1, ATM, BARD1, BRCA1, BRCA2, BRIP1, CDH1, CHEK2, FAM175A, FANCC, MRE11, MUTYH, NBN, NF1, PALB2, PIK3CA, PTEN, RAD50, RAD51C, RAD51D, RINT1, SDHB, SDHD, STK11, TP53, and XRCC2.   The report date is October 11, 2015.  . IBS (irritable bowel syndrome)   . Osteoporosis 2008   Prolia since 04/2011 given by Ortho    Past Surgical History:  Procedure Laterality Date  . CLAVICLE SURGERY  11/06   after fracture, placed screw and later removed.  . COLPOSCOPY  2006   CIN I, managed conservatively  . FOREARM FRACTURE SURGERY Right 1967   rods placed, surgically removed later  .  NASAL SINUS SURGERY  1996   deviated septum  . UMBILICAL HERNIA REPAIR  1958    Current Outpatient Medications  Medication Sig Dispense Refill  . Calcium Carbonate-Vitamin D (CALCIUM-D) 600-400 MG-UNIT TABS Take 1 tablet by mouth daily.    . cholecalciferol (VITAMIN D) 1000 UNITS tablet Take 1,000 Units by mouth daily.    . Marland Kitchenenosumab (PROLIA) 60 MG/ML SOLN injection Inject 60 mg into the skin every 6 (six) months. Administer in upper arm, thigh, or abdomen    . FLUoxetine (PROZAC) 20 MG capsule Take 40 mg by mouth daily.     . Glucosamine 500 MG CAPS Take 1 capsule by mouth daily.    . Omega-3 Fatty Acids (FISH OIL PO) Take by mouth.    . zolpidem (AMBIEN) 10 MG tablet Take 5 mg by mouth at bedtime as needed.      No current facility-administered medications for this visit.    Family History  Problem Relation Age of Onset  . Kidney failure Mother   . Heart attack Mother   . Pancreatic cancer Cousin 5142     died within 6 16 months. Heart disease Father   . Prostate cancer Father 7361     d.74  . Thyroid disease Sister   . Osteoporosis Sister   . Ovarian cancer Sister 6990. Breast cancer  Paternal Aunt 63       mastectomy  . Pancreatic cancer Paternal Aunt 81       chemo and radiation  . Breast cancer Paternal Aunt 82  . Prostate cancer Paternal Uncle 17  . Testicular cancer Maternal Grandfather     ROS:  Pertinent items are noted in HPI.  Otherwise, a comprehensive ROS was negative.  Exam:   LMP 08/16/2004 (Approximate)    Ht Readings from Last 3 Encounters:  04/17/18 5' 3.5" (1.613 m)  04/10/17 5' 3.5" (1.613 m)  04/08/16 5' 3.5" (1.613 m)    General appearance: alert, cooperative and appears stated age Head: Normocephalic, without obvious abnormality, atraumatic Neck: no adenopathy, supple, symmetrical, trachea midline and thyroid {EXAM; THYROID:18604} Lungs: clear to auscultation bilaterally Breasts: {Exam; breast:13139::"normal appearance, no masses or  tenderness"} Heart: regular rate and rhythm Abdomen: soft, non-tender; no masses,  no organomegaly Extremities: extremities normal, atraumatic, no cyanosis or edema Skin: Skin color, texture, turgor normal. No rashes or lesions Lymph nodes: Cervical, supraclavicular, and axillary nodes normal. No abnormal inguinal nodes palpated Neurologic: Grossly normal   Pelvic: External genitalia:  no lesions              Urethra:  normal appearing urethra with no masses, tenderness or lesions              Bartholin's and Skene's: normal                 Vagina: normal appearing vagina with normal color and discharge, no lesions              Cervix: {exam; cervix:14595}              Pap taken: {yes no:314532} Bimanual Exam:  Uterus:  {exam; uterus:12215}              Adnexa: {exam; adnexa:12223}               Rectovaginal: Confirms               Anus:  normal sphincter tone, no lesions  Chaperone present: ***  A:  Well Woman with normal exam  P:   Reviewed health and wellness pertinent to exam  Pap smear: {YES NO:22349}  {plan; gyn:5269::"mammogram","pap smear","return annually or prn"}  An After Visit Summary was printed and given to the patient.

## 2019-04-21 ENCOUNTER — Ambulatory Visit: Payer: Medicare Other | Admitting: Certified Nurse Midwife

## 2019-04-21 ENCOUNTER — Other Ambulatory Visit: Payer: Self-pay

## 2019-04-21 ENCOUNTER — Telehealth: Payer: Self-pay | Admitting: Certified Nurse Midwife

## 2019-04-21 NOTE — Telephone Encounter (Signed)
Patient cancelled her aex because she is waiting covid results. 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 Offered to reschedule to another day, she did not wish to reschedule.

## 2019-04-21 NOTE — Telephone Encounter (Signed)
Spoke to pt. Pt was here for AEX with Debbi, CNM today and was told to reschedule due to not having current covid results from last week. Pt is Pfizer vaccine study. Pt denies sx of covid. Pt expressed frustration. Declined to reschedule at this time.  Routing to Engineer, building services and provider. Encounter closed.

## 2019-05-18 DIAGNOSIS — G4700 Insomnia, unspecified: Secondary | ICD-10-CM | POA: Diagnosis not present

## 2019-05-18 DIAGNOSIS — F331 Major depressive disorder, recurrent, moderate: Secondary | ICD-10-CM | POA: Diagnosis not present

## 2019-06-08 ENCOUNTER — Encounter: Payer: Self-pay | Admitting: Certified Nurse Midwife

## 2019-06-22 DIAGNOSIS — M85851 Other specified disorders of bone density and structure, right thigh: Secondary | ICD-10-CM | POA: Diagnosis not present

## 2019-06-22 DIAGNOSIS — M85852 Other specified disorders of bone density and structure, left thigh: Secondary | ICD-10-CM | POA: Diagnosis not present

## 2019-06-30 DIAGNOSIS — L821 Other seborrheic keratosis: Secondary | ICD-10-CM | POA: Diagnosis not present

## 2019-06-30 DIAGNOSIS — L820 Inflamed seborrheic keratosis: Secondary | ICD-10-CM | POA: Diagnosis not present

## 2019-06-30 DIAGNOSIS — D1801 Hemangioma of skin and subcutaneous tissue: Secondary | ICD-10-CM | POA: Diagnosis not present

## 2019-07-01 DIAGNOSIS — F331 Major depressive disorder, recurrent, moderate: Secondary | ICD-10-CM | POA: Diagnosis not present

## 2019-07-01 DIAGNOSIS — G4700 Insomnia, unspecified: Secondary | ICD-10-CM | POA: Diagnosis not present

## 2019-07-12 DIAGNOSIS — M810 Age-related osteoporosis without current pathological fracture: Secondary | ICD-10-CM | POA: Diagnosis not present

## 2019-07-12 DIAGNOSIS — R002 Palpitations: Secondary | ICD-10-CM | POA: Diagnosis not present

## 2019-07-12 DIAGNOSIS — D72819 Decreased white blood cell count, unspecified: Secondary | ICD-10-CM | POA: Diagnosis not present

## 2019-07-12 DIAGNOSIS — E538 Deficiency of other specified B group vitamins: Secondary | ICD-10-CM | POA: Diagnosis not present

## 2019-07-12 DIAGNOSIS — R7989 Other specified abnormal findings of blood chemistry: Secondary | ICD-10-CM | POA: Diagnosis not present

## 2019-07-20 DIAGNOSIS — M1990 Unspecified osteoarthritis, unspecified site: Secondary | ICD-10-CM | POA: Diagnosis not present

## 2019-07-20 DIAGNOSIS — R600 Localized edema: Secondary | ICD-10-CM | POA: Diagnosis not present

## 2019-07-20 DIAGNOSIS — G4700 Insomnia, unspecified: Secondary | ICD-10-CM | POA: Diagnosis not present

## 2019-07-20 DIAGNOSIS — M810 Age-related osteoporosis without current pathological fracture: Secondary | ICD-10-CM | POA: Diagnosis not present

## 2019-07-20 DIAGNOSIS — H748X9 Other specified disorders of middle ear and mastoid, unspecified ear: Secondary | ICD-10-CM | POA: Diagnosis not present

## 2019-07-20 DIAGNOSIS — E538 Deficiency of other specified B group vitamins: Secondary | ICD-10-CM | POA: Diagnosis not present

## 2019-07-20 DIAGNOSIS — R82998 Other abnormal findings in urine: Secondary | ICD-10-CM | POA: Diagnosis not present

## 2019-07-20 DIAGNOSIS — Z0000 Encounter for general adult medical examination without abnormal findings: Secondary | ICD-10-CM | POA: Diagnosis not present

## 2019-07-20 DIAGNOSIS — D649 Anemia, unspecified: Secondary | ICD-10-CM | POA: Diagnosis not present

## 2019-07-20 DIAGNOSIS — D72819 Decreased white blood cell count, unspecified: Secondary | ICD-10-CM | POA: Diagnosis not present

## 2019-07-20 DIAGNOSIS — H269 Unspecified cataract: Secondary | ICD-10-CM | POA: Diagnosis not present

## 2019-07-20 DIAGNOSIS — R002 Palpitations: Secondary | ICD-10-CM | POA: Diagnosis not present

## 2019-07-20 DIAGNOSIS — F4320 Adjustment disorder, unspecified: Secondary | ICD-10-CM | POA: Diagnosis not present

## 2019-07-23 DIAGNOSIS — Z1212 Encounter for screening for malignant neoplasm of rectum: Secondary | ICD-10-CM | POA: Diagnosis not present

## 2019-10-27 DIAGNOSIS — H524 Presbyopia: Secondary | ICD-10-CM | POA: Diagnosis not present

## 2019-10-27 DIAGNOSIS — H52223 Regular astigmatism, bilateral: Secondary | ICD-10-CM | POA: Diagnosis not present

## 2019-10-27 DIAGNOSIS — H5213 Myopia, bilateral: Secondary | ICD-10-CM | POA: Diagnosis not present

## 2019-10-27 DIAGNOSIS — H25813 Combined forms of age-related cataract, bilateral: Secondary | ICD-10-CM | POA: Diagnosis not present

## 2019-10-27 DIAGNOSIS — H43813 Vitreous degeneration, bilateral: Secondary | ICD-10-CM | POA: Diagnosis not present

## 2019-11-03 DIAGNOSIS — F331 Major depressive disorder, recurrent, moderate: Secondary | ICD-10-CM | POA: Diagnosis not present

## 2019-11-03 DIAGNOSIS — G4700 Insomnia, unspecified: Secondary | ICD-10-CM | POA: Diagnosis not present

## 2019-12-29 DIAGNOSIS — Z23 Encounter for immunization: Secondary | ICD-10-CM | POA: Diagnosis not present

## 2020-02-02 DIAGNOSIS — G4700 Insomnia, unspecified: Secondary | ICD-10-CM | POA: Diagnosis not present

## 2020-02-02 DIAGNOSIS — F331 Major depressive disorder, recurrent, moderate: Secondary | ICD-10-CM | POA: Diagnosis not present

## 2020-02-22 DIAGNOSIS — M810 Age-related osteoporosis without current pathological fracture: Secondary | ICD-10-CM | POA: Diagnosis not present

## 2020-02-22 DIAGNOSIS — R5383 Other fatigue: Secondary | ICD-10-CM | POA: Diagnosis not present

## 2020-02-22 DIAGNOSIS — E559 Vitamin D deficiency, unspecified: Secondary | ICD-10-CM | POA: Diagnosis not present

## 2020-02-25 IMAGING — MG DIGITAL SCREENING BILAT W/ TOMO W/ CAD
6 of 10 series · 6 of 30 positions shown · non-contrast
Comparison: Previous exam(s).

CLINICAL DATA: Screening.

EXAM:
DIGITAL SCREENING BILATERAL MAMMOGRAM WITH TOMO AND CAD

[L MLO synth-2D]
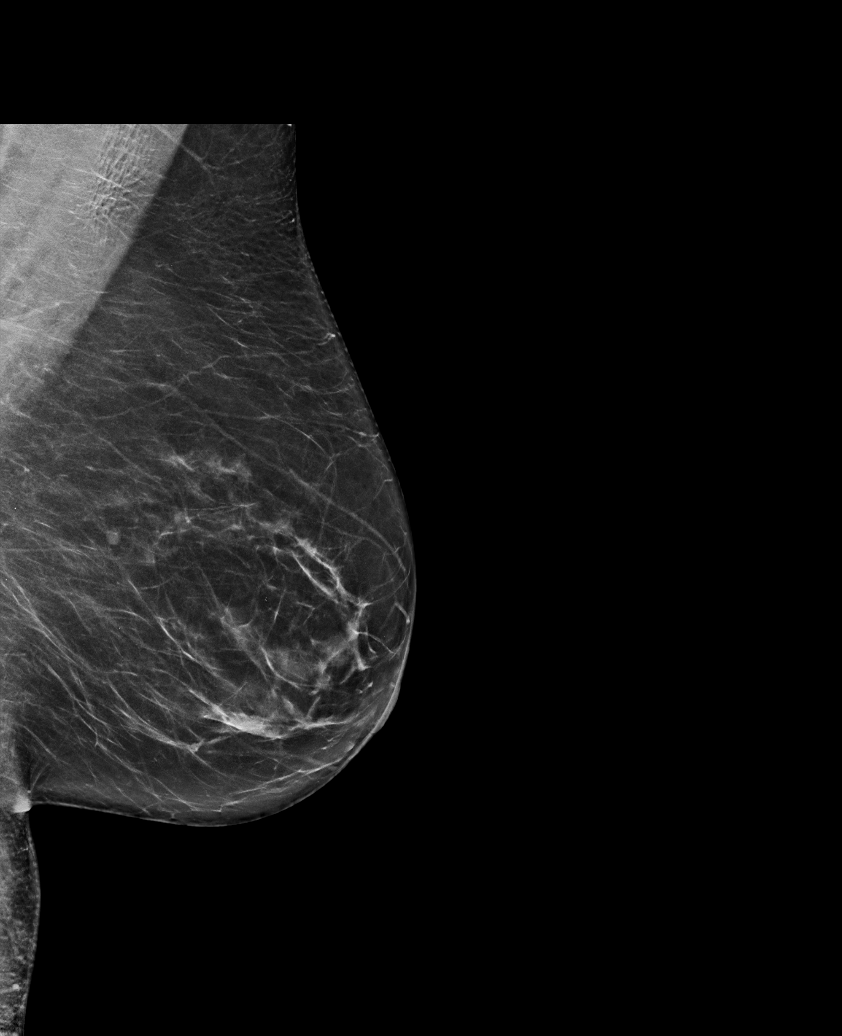

[L CC synth-2D]
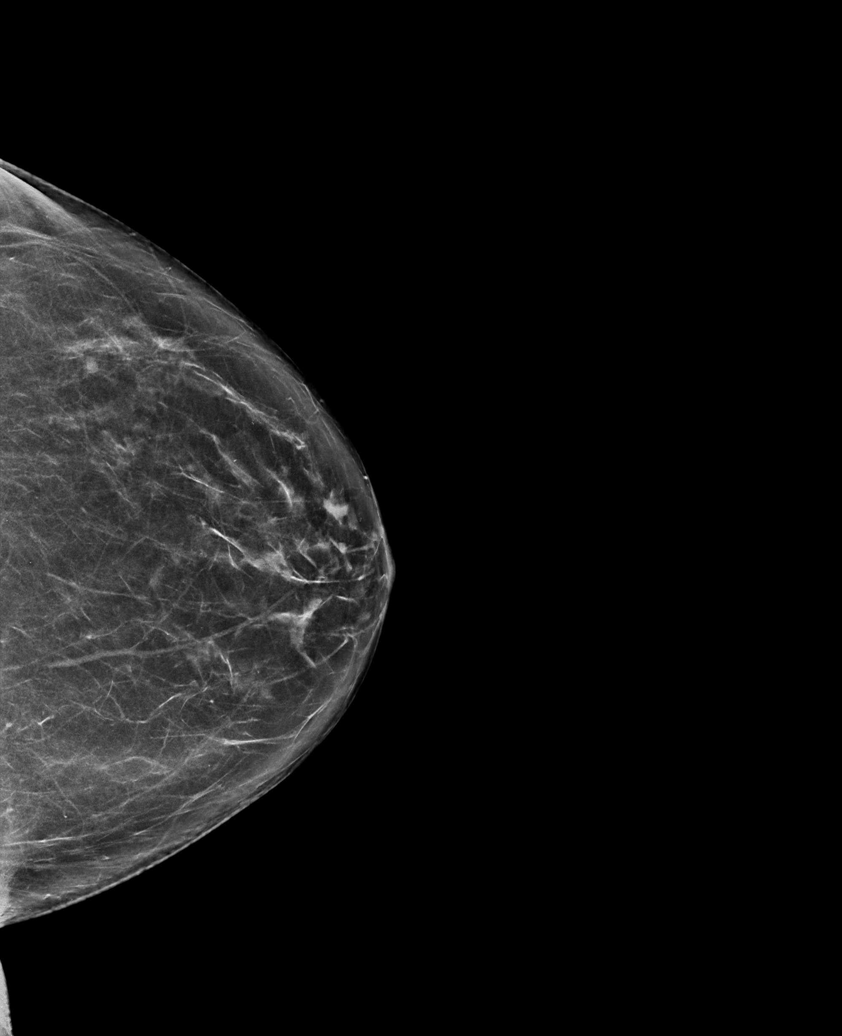

[R CC synth-2D (1 of 2)]
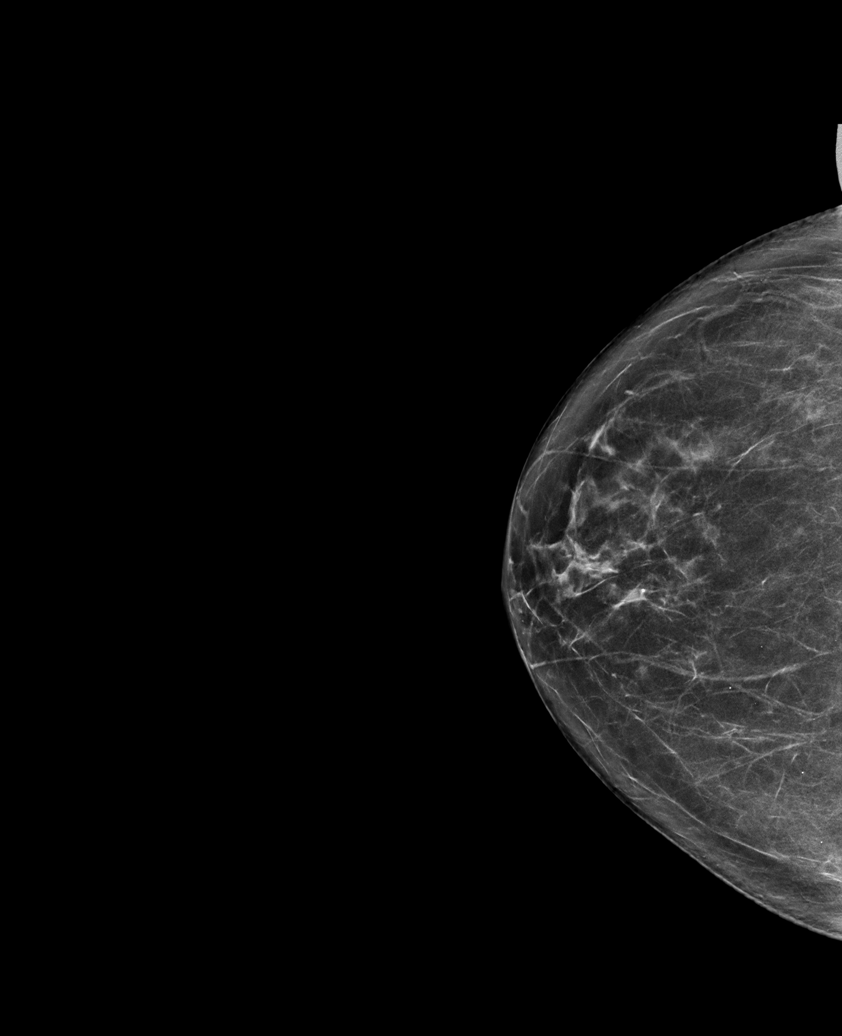

[R MLO synth-2D]
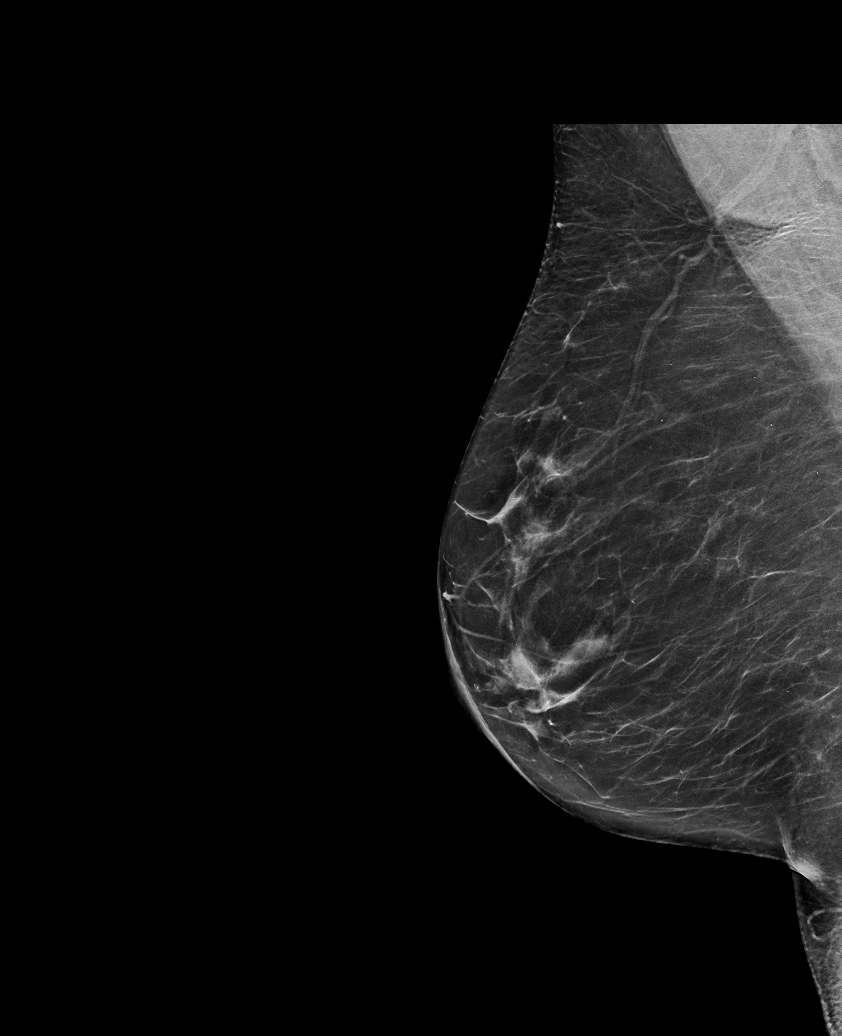

[R CC synth-2D (2 of 2)]
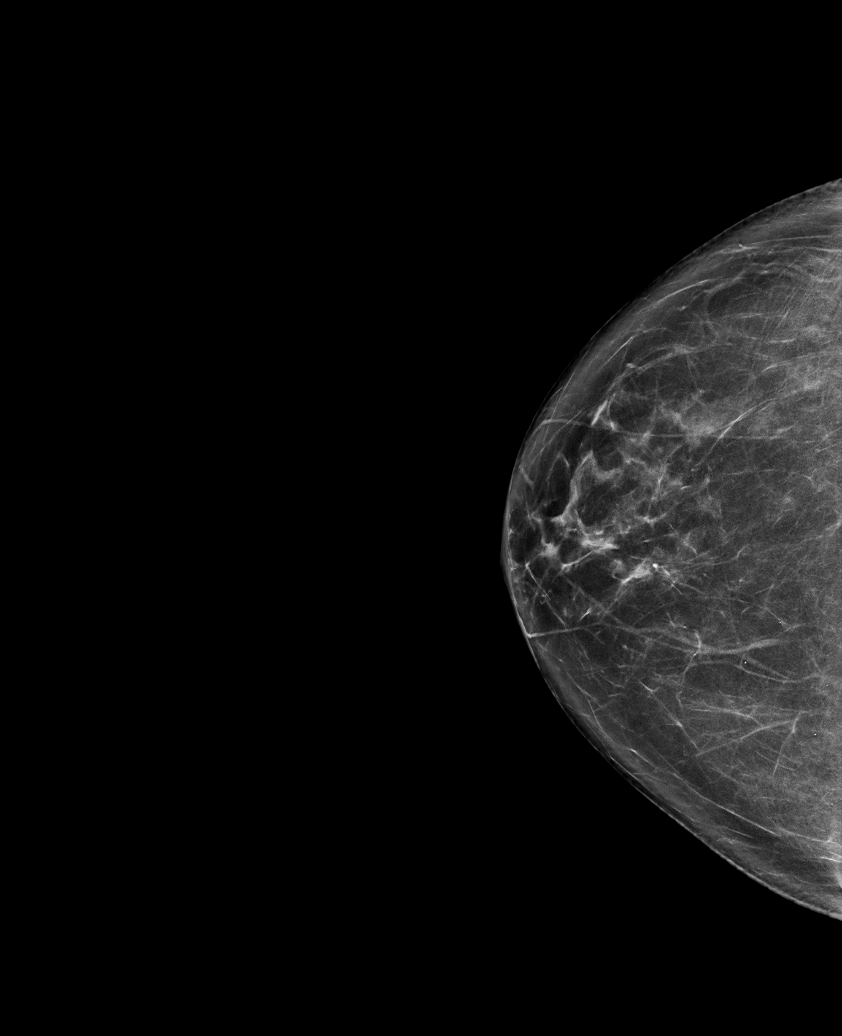

[R MLO tomo · tomo slice 37/73.0]
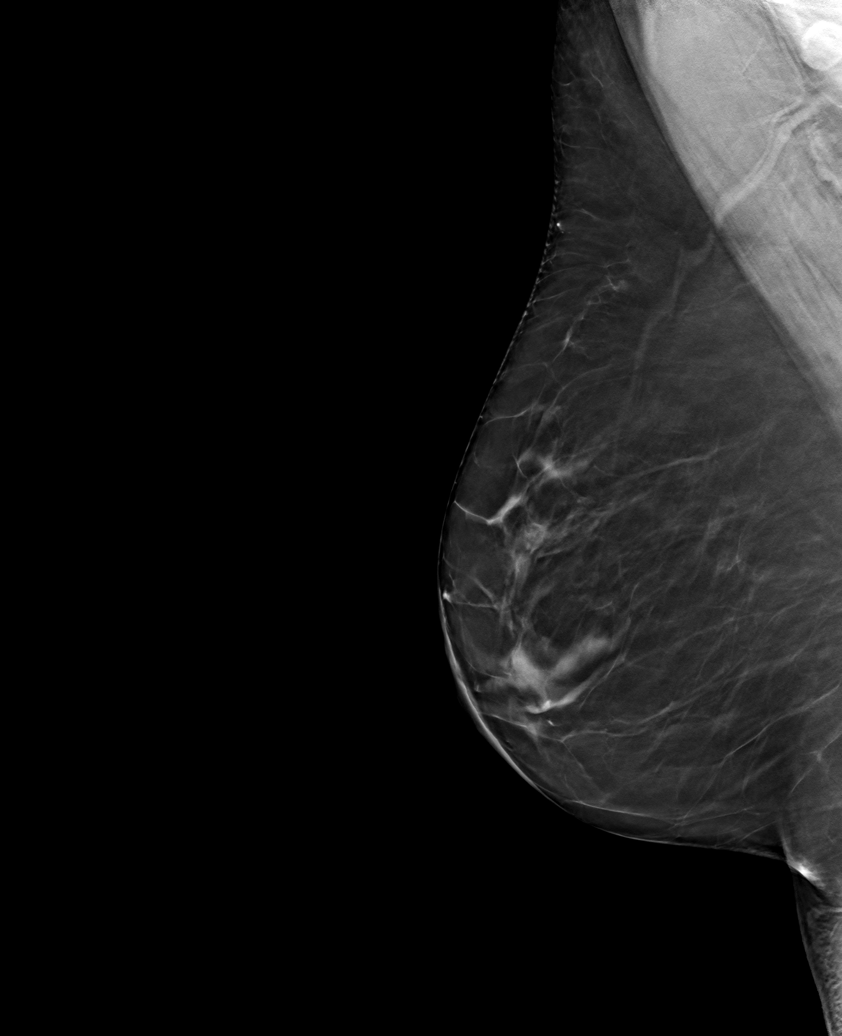

[6 of 30 positions shown; findings below may reference images not displayed]

ACR Breast Density Category b: There are scattered areas of
fibroglandular density.
FINDINGS: There are no findings suspicious for malignancy. Images were
processed with CAD.
IMPRESSION: No mammographic evidence of malignancy. A result letter of this
screening mammogram will be mailed directly to the patient.

RECOMMENDATION:
Screening mammogram in one year. (Code:[id number])

BI-RADS CATEGORY  1: Negative.

## 2020-03-28 ENCOUNTER — Other Ambulatory Visit: Payer: Self-pay | Admitting: Obstetrics and Gynecology

## 2020-03-28 DIAGNOSIS — Z1231 Encounter for screening mammogram for malignant neoplasm of breast: Secondary | ICD-10-CM

## 2020-03-29 DIAGNOSIS — M810 Age-related osteoporosis without current pathological fracture: Secondary | ICD-10-CM | POA: Diagnosis not present

## 2020-03-29 DIAGNOSIS — E559 Vitamin D deficiency, unspecified: Secondary | ICD-10-CM | POA: Diagnosis not present

## 2020-03-30 ENCOUNTER — Other Ambulatory Visit: Payer: Self-pay

## 2020-03-30 ENCOUNTER — Ambulatory Visit
Admission: RE | Admit: 2020-03-30 | Discharge: 2020-03-30 | Disposition: A | Discharge status: Home / Self Care | Payer: Medicare Other | Source: Ambulatory Visit

## 2020-03-30 DIAGNOSIS — Z1231 Encounter for screening mammogram for malignant neoplasm of breast: Secondary | ICD-10-CM

## 2020-04-26 DIAGNOSIS — H43813 Vitreous degeneration, bilateral: Secondary | ICD-10-CM | POA: Diagnosis not present

## 2020-04-26 DIAGNOSIS — H25813 Combined forms of age-related cataract, bilateral: Secondary | ICD-10-CM | POA: Diagnosis not present

## 2020-04-26 DIAGNOSIS — H5213 Myopia, bilateral: Secondary | ICD-10-CM | POA: Diagnosis not present

## 2020-04-26 DIAGNOSIS — H524 Presbyopia: Secondary | ICD-10-CM | POA: Diagnosis not present

## 2020-04-26 DIAGNOSIS — H52223 Regular astigmatism, bilateral: Secondary | ICD-10-CM | POA: Diagnosis not present

## 2020-05-16 DIAGNOSIS — F331 Major depressive disorder, recurrent, moderate: Secondary | ICD-10-CM | POA: Diagnosis not present

## 2020-05-16 DIAGNOSIS — G4700 Insomnia, unspecified: Secondary | ICD-10-CM | POA: Diagnosis not present

## 2020-07-14 DIAGNOSIS — E538 Deficiency of other specified B group vitamins: Secondary | ICD-10-CM | POA: Diagnosis not present

## 2020-07-14 DIAGNOSIS — E559 Vitamin D deficiency, unspecified: Secondary | ICD-10-CM | POA: Diagnosis not present

## 2020-07-14 DIAGNOSIS — D649 Anemia, unspecified: Secondary | ICD-10-CM | POA: Diagnosis not present

## 2020-07-14 DIAGNOSIS — R7989 Other specified abnormal findings of blood chemistry: Secondary | ICD-10-CM | POA: Diagnosis not present

## 2020-07-21 DIAGNOSIS — N94810 Vulvar vestibulitis: Secondary | ICD-10-CM | POA: Diagnosis not present

## 2020-07-21 DIAGNOSIS — F4320 Adjustment disorder, unspecified: Secondary | ICD-10-CM | POA: Diagnosis not present

## 2020-07-21 DIAGNOSIS — F325 Major depressive disorder, single episode, in full remission: Secondary | ICD-10-CM | POA: Diagnosis not present

## 2020-07-21 DIAGNOSIS — Z0000 Encounter for general adult medical examination without abnormal findings: Secondary | ICD-10-CM | POA: Diagnosis not present

## 2020-07-21 DIAGNOSIS — Z1331 Encounter for screening for depression: Secondary | ICD-10-CM | POA: Diagnosis not present

## 2020-07-21 DIAGNOSIS — R82998 Other abnormal findings in urine: Secondary | ICD-10-CM | POA: Diagnosis not present

## 2020-07-21 DIAGNOSIS — M797 Fibromyalgia: Secondary | ICD-10-CM | POA: Diagnosis not present

## 2020-07-21 DIAGNOSIS — M1990 Unspecified osteoarthritis, unspecified site: Secondary | ICD-10-CM | POA: Diagnosis not present

## 2020-07-21 DIAGNOSIS — Z23 Encounter for immunization: Secondary | ICD-10-CM | POA: Diagnosis not present

## 2020-07-21 DIAGNOSIS — R600 Localized edema: Secondary | ICD-10-CM | POA: Diagnosis not present

## 2020-07-21 DIAGNOSIS — E538 Deficiency of other specified B group vitamins: Secondary | ICD-10-CM | POA: Diagnosis not present

## 2020-07-21 DIAGNOSIS — R002 Palpitations: Secondary | ICD-10-CM | POA: Diagnosis not present

## 2020-07-21 DIAGNOSIS — Z1339 Encounter for screening examination for other mental health and behavioral disorders: Secondary | ICD-10-CM | POA: Diagnosis not present

## 2020-07-21 DIAGNOSIS — D72819 Decreased white blood cell count, unspecified: Secondary | ICD-10-CM | POA: Diagnosis not present

## 2020-07-21 DIAGNOSIS — Z1212 Encounter for screening for malignant neoplasm of rectum: Secondary | ICD-10-CM | POA: Diagnosis not present

## 2020-08-08 DIAGNOSIS — F331 Major depressive disorder, recurrent, moderate: Secondary | ICD-10-CM | POA: Diagnosis not present

## 2020-08-08 DIAGNOSIS — G4700 Insomnia, unspecified: Secondary | ICD-10-CM | POA: Diagnosis not present

## 2020-09-19 DIAGNOSIS — Z01419 Encounter for gynecological examination (general) (routine) without abnormal findings: Secondary | ICD-10-CM | POA: Diagnosis not present

## 2020-09-19 DIAGNOSIS — N94810 Vulvar vestibulitis: Secondary | ICD-10-CM | POA: Diagnosis not present

## 2020-09-19 DIAGNOSIS — Z6825 Body mass index (BMI) 25.0-25.9, adult: Secondary | ICD-10-CM | POA: Diagnosis not present

## 2020-09-19 DIAGNOSIS — M810 Age-related osteoporosis without current pathological fracture: Secondary | ICD-10-CM | POA: Diagnosis not present

## 2020-10-04 DIAGNOSIS — E559 Vitamin D deficiency, unspecified: Secondary | ICD-10-CM | POA: Diagnosis not present

## 2020-10-04 DIAGNOSIS — M810 Age-related osteoporosis without current pathological fracture: Secondary | ICD-10-CM | POA: Diagnosis not present

## 2020-10-30 DIAGNOSIS — H43813 Vitreous degeneration, bilateral: Secondary | ICD-10-CM | POA: Diagnosis not present

## 2020-10-30 DIAGNOSIS — H25813 Combined forms of age-related cataract, bilateral: Secondary | ICD-10-CM | POA: Diagnosis not present

## 2020-10-30 DIAGNOSIS — H5213 Myopia, bilateral: Secondary | ICD-10-CM | POA: Diagnosis not present

## 2020-12-06 DIAGNOSIS — G4700 Insomnia, unspecified: Secondary | ICD-10-CM | POA: Diagnosis not present

## 2020-12-06 DIAGNOSIS — F331 Major depressive disorder, recurrent, moderate: Secondary | ICD-10-CM | POA: Diagnosis not present

## 2020-12-27 DIAGNOSIS — Z23 Encounter for immunization: Secondary | ICD-10-CM | POA: Diagnosis not present

## 2021-03-21 DIAGNOSIS — G4700 Insomnia, unspecified: Secondary | ICD-10-CM | POA: Diagnosis not present

## 2021-03-21 DIAGNOSIS — F331 Major depressive disorder, recurrent, moderate: Secondary | ICD-10-CM | POA: Diagnosis not present

## 2021-05-07 ENCOUNTER — Other Ambulatory Visit: Payer: Self-pay | Admitting: Obstetrics and Gynecology

## 2021-05-07 DIAGNOSIS — Z1231 Encounter for screening mammogram for malignant neoplasm of breast: Secondary | ICD-10-CM

## 2021-05-07 DIAGNOSIS — E559 Vitamin D deficiency, unspecified: Secondary | ICD-10-CM | POA: Diagnosis not present

## 2021-05-07 DIAGNOSIS — R5383 Other fatigue: Secondary | ICD-10-CM | POA: Diagnosis not present

## 2021-05-07 DIAGNOSIS — M810 Age-related osteoporosis without current pathological fracture: Secondary | ICD-10-CM | POA: Diagnosis not present

## 2021-05-09 DIAGNOSIS — M810 Age-related osteoporosis without current pathological fracture: Secondary | ICD-10-CM | POA: Diagnosis not present

## 2021-05-09 DIAGNOSIS — E559 Vitamin D deficiency, unspecified: Secondary | ICD-10-CM | POA: Diagnosis not present

## 2021-05-17 ENCOUNTER — Ambulatory Visit
Admission: RE | Admit: 2021-05-17 | Discharge: 2021-05-17 | Disposition: A | Discharge status: Home / Self Care | Payer: Medicare Other | Source: Ambulatory Visit | Attending: Obstetrics and Gynecology | Admitting: Obstetrics and Gynecology

## 2021-05-17 DIAGNOSIS — Z1231 Encounter for screening mammogram for malignant neoplasm of breast: Secondary | ICD-10-CM

## 2021-06-13 DIAGNOSIS — G4700 Insomnia, unspecified: Secondary | ICD-10-CM | POA: Diagnosis not present

## 2021-06-13 DIAGNOSIS — F331 Major depressive disorder, recurrent, moderate: Secondary | ICD-10-CM | POA: Diagnosis not present

## 2021-06-27 DIAGNOSIS — M85852 Other specified disorders of bone density and structure, left thigh: Secondary | ICD-10-CM | POA: Diagnosis not present

## 2021-06-27 DIAGNOSIS — Z780 Asymptomatic menopausal state: Secondary | ICD-10-CM | POA: Diagnosis not present

## 2021-07-31 DIAGNOSIS — D649 Anemia, unspecified: Secondary | ICD-10-CM | POA: Diagnosis not present

## 2021-07-31 DIAGNOSIS — R7989 Other specified abnormal findings of blood chemistry: Secondary | ICD-10-CM | POA: Diagnosis not present

## 2021-07-31 DIAGNOSIS — E559 Vitamin D deficiency, unspecified: Secondary | ICD-10-CM | POA: Diagnosis not present

## 2021-07-31 DIAGNOSIS — Z0000 Encounter for general adult medical examination without abnormal findings: Secondary | ICD-10-CM | POA: Diagnosis not present

## 2021-07-31 DIAGNOSIS — E538 Deficiency of other specified B group vitamins: Secondary | ICD-10-CM | POA: Diagnosis not present

## 2021-08-07 DIAGNOSIS — E538 Deficiency of other specified B group vitamins: Secondary | ICD-10-CM | POA: Diagnosis not present

## 2021-08-07 DIAGNOSIS — Z0000 Encounter for general adult medical examination without abnormal findings: Secondary | ICD-10-CM | POA: Diagnosis not present

## 2021-08-07 DIAGNOSIS — E559 Vitamin D deficiency, unspecified: Secondary | ICD-10-CM | POA: Diagnosis not present

## 2021-08-07 DIAGNOSIS — Z1331 Encounter for screening for depression: Secondary | ICD-10-CM | POA: Diagnosis not present

## 2021-08-07 DIAGNOSIS — M810 Age-related osteoporosis without current pathological fracture: Secondary | ICD-10-CM | POA: Diagnosis not present

## 2021-08-07 DIAGNOSIS — Z1339 Encounter for screening examination for other mental health and behavioral disorders: Secondary | ICD-10-CM | POA: Diagnosis not present

## 2021-08-07 DIAGNOSIS — D72819 Decreased white blood cell count, unspecified: Secondary | ICD-10-CM | POA: Diagnosis not present

## 2021-08-07 DIAGNOSIS — N952 Postmenopausal atrophic vaginitis: Secondary | ICD-10-CM | POA: Diagnosis not present

## 2021-08-07 DIAGNOSIS — G4700 Insomnia, unspecified: Secondary | ICD-10-CM | POA: Diagnosis not present

## 2021-08-07 DIAGNOSIS — F4320 Adjustment disorder, unspecified: Secondary | ICD-10-CM | POA: Diagnosis not present

## 2021-09-12 DIAGNOSIS — F331 Major depressive disorder, recurrent, moderate: Secondary | ICD-10-CM | POA: Diagnosis not present

## 2021-09-12 DIAGNOSIS — G4700 Insomnia, unspecified: Secondary | ICD-10-CM | POA: Diagnosis not present

## 2021-11-28 DIAGNOSIS — M47816 Spondylosis without myelopathy or radiculopathy, lumbar region: Secondary | ICD-10-CM | POA: Diagnosis not present

## 2021-11-28 DIAGNOSIS — M810 Age-related osteoporosis without current pathological fracture: Secondary | ICD-10-CM | POA: Diagnosis not present

## 2021-11-28 DIAGNOSIS — M160 Bilateral primary osteoarthritis of hip: Secondary | ICD-10-CM | POA: Diagnosis not present

## 2021-12-10 DIAGNOSIS — H5213 Myopia, bilateral: Secondary | ICD-10-CM | POA: Diagnosis not present

## 2021-12-10 DIAGNOSIS — H43813 Vitreous degeneration, bilateral: Secondary | ICD-10-CM | POA: Diagnosis not present

## 2021-12-10 DIAGNOSIS — H25813 Combined forms of age-related cataract, bilateral: Secondary | ICD-10-CM | POA: Diagnosis not present

## 2021-12-25 DIAGNOSIS — Z23 Encounter for immunization: Secondary | ICD-10-CM | POA: Diagnosis not present

## 2022-01-21 DIAGNOSIS — H25813 Combined forms of age-related cataract, bilateral: Secondary | ICD-10-CM | POA: Diagnosis not present

## 2022-01-21 DIAGNOSIS — H25811 Combined forms of age-related cataract, right eye: Secondary | ICD-10-CM | POA: Diagnosis not present

## 2022-01-29 DIAGNOSIS — H25811 Combined forms of age-related cataract, right eye: Secondary | ICD-10-CM | POA: Diagnosis not present

## 2022-01-29 DIAGNOSIS — H269 Unspecified cataract: Secondary | ICD-10-CM | POA: Diagnosis not present

## 2022-01-29 DIAGNOSIS — H2511 Age-related nuclear cataract, right eye: Secondary | ICD-10-CM | POA: Diagnosis not present

## 2022-02-04 DIAGNOSIS — H25812 Combined forms of age-related cataract, left eye: Secondary | ICD-10-CM | POA: Diagnosis not present

## 2022-02-12 DIAGNOSIS — H2512 Age-related nuclear cataract, left eye: Secondary | ICD-10-CM | POA: Diagnosis not present

## 2022-02-12 DIAGNOSIS — H269 Unspecified cataract: Secondary | ICD-10-CM | POA: Diagnosis not present

## 2022-02-12 DIAGNOSIS — H25812 Combined forms of age-related cataract, left eye: Secondary | ICD-10-CM | POA: Diagnosis not present

## 2022-02-13 DIAGNOSIS — G4700 Insomnia, unspecified: Secondary | ICD-10-CM | POA: Diagnosis not present

## 2022-02-13 DIAGNOSIS — F331 Major depressive disorder, recurrent, moderate: Secondary | ICD-10-CM | POA: Diagnosis not present

## 2022-04-09 ENCOUNTER — Other Ambulatory Visit: Payer: Self-pay | Admitting: Internal Medicine

## 2022-04-09 DIAGNOSIS — Z1231 Encounter for screening mammogram for malignant neoplasm of breast: Secondary | ICD-10-CM

## 2022-05-15 DIAGNOSIS — M810 Age-related osteoporosis without current pathological fracture: Secondary | ICD-10-CM | POA: Diagnosis not present

## 2022-05-15 DIAGNOSIS — R5383 Other fatigue: Secondary | ICD-10-CM | POA: Diagnosis not present

## 2022-05-15 DIAGNOSIS — E559 Vitamin D deficiency, unspecified: Secondary | ICD-10-CM | POA: Diagnosis not present

## 2022-05-16 DIAGNOSIS — F331 Major depressive disorder, recurrent, moderate: Secondary | ICD-10-CM | POA: Diagnosis not present

## 2022-05-16 DIAGNOSIS — G4700 Insomnia, unspecified: Secondary | ICD-10-CM | POA: Diagnosis not present

## 2022-05-22 ENCOUNTER — Other Ambulatory Visit: Payer: Self-pay

## 2022-05-23 ENCOUNTER — Telehealth: Payer: Self-pay | Admitting: Pharmacy Technician

## 2022-05-23 NOTE — Telephone Encounter (Signed)
Auth Submission: NO AUTH NEEDED Payer: MEDICARE A/B & AARP supp Medication & CPT/J Code(s) submitted: Prolia (Denosumab) [phone] Route of submission (phone, fax, portal):  Phone # Fax # Auth type: Buy/Bill Units/visits requested: 2 Reference number:  Approval from: 05/23/22 to 05/23/23   Medicare will cover 80% and AARP supp will pick-up remaining 20%

## 2022-05-28 ENCOUNTER — Encounter: Payer: Self-pay | Admitting: Sports Medicine

## 2022-05-28 ENCOUNTER — Ambulatory Visit
Admission: RE | Admit: 2022-05-28 | Discharge: 2022-05-28 | Disposition: A | Discharge status: Home / Self Care | Payer: Medicare Other | Source: Ambulatory Visit

## 2022-05-28 DIAGNOSIS — Z1231 Encounter for screening mammogram for malignant neoplasm of breast: Secondary | ICD-10-CM

## 2022-06-04 ENCOUNTER — Ambulatory Visit (INDEPENDENT_AMBULATORY_CARE_PROVIDER_SITE_OTHER): Payer: Medicare Other | Admitting: *Deleted

## 2022-06-04 VITALS — BP 109/72 | HR 70 | Temp 97.8°F | Resp 16 | Ht 64.0 in | Wt 151.4 lb

## 2022-06-04 DIAGNOSIS — M818 Other osteoporosis without current pathological fracture: Secondary | ICD-10-CM

## 2022-06-04 MED ORDER — DENOSUMAB 60 MG/ML ~~LOC~~ SOSY
60.0000 mg | PREFILLED_SYRINGE | Freq: Once | SUBCUTANEOUS | Status: AC
  Administered 2022-06-04: 60 mg via SUBCUTANEOUS
  Filled 2022-06-04: qty 1

## 2022-06-04 NOTE — Patient Instructions (Signed)
Denosumab Injection (Osteoporosis) What is this medication? DENOSUMAB (den oh SUE mab) prevents and treats osteoporosis. It works by making your bones stronger and less likely to break (fracture). It is a monoclonal antibody. This medicine may be used for other purposes; ask your health care provider or pharmacist if you have questions. COMMON BRAND NAME(S): Prolia What should I tell my care team before I take this medication? They need to know if you have any of these conditions: Dental or gum disease, or plan to have dental surgery or a tooth pulled Infection Kidney disease Low levels of calcium or vitamin D in your blood On dialysis Poor nutrition Skin conditions Thyroid disease, or have had thyroid or parathyroid surgery Trouble absorbing minerals in your stomach or intestine An unusual or allergic reaction to denosumab, other medications, foods, dyes, or preservatives Pregnant or trying to get pregnant Breastfeeding How should I use this medication? This medication is injected under the skin. It is given by your care team in a hospital or clinic setting. A special MedGuide will be given to you before each treatment. Be sure to read this information carefully each time. Talk to your care team about the use of this medication in children. Special care may be needed. Overdosage: If you think you have taken too much of this medicine contact a poison control center or emergency room at once. NOTE: This medicine is only for you. Do not share this medicine with others. What if I miss a dose? Keep appointments for follow-up doses. It is important not to miss your dose. Call your care team if you are unable to keep an appointment. What may interact with this medication? Do not take this medication with any of the following: Other medications that contain denosumab This medication may also interact with the following: Medications that lower your chance of fighting infection Steroid  medications, such as prednisone or cortisone This list may not describe all possible interactions. Give your health care provider a list of all the medicines, herbs, non-prescription drugs, or dietary supplements you use. Also tell them if you smoke, drink alcohol, or use illegal drugs. Some items may interact with your medicine. What should I watch for while using this medication? Your condition will be monitored carefully while you are receiving this medication. You may need blood work while taking this medication. This medication may increase your risk of getting an infection. Call your care team for advice if you get a fever, chills, sore throat, or other symptoms of a cold or flu. Do not treat yourself. Try to avoid being around people who are sick. Tell your dentist and dental surgeon that you are taking this medication. You should not have major dental surgery while on this medication. See your dentist to have a dental exam and fix any dental problems before starting this medication. Take good care of your teeth while on this medication. Make sure you see your dentist for regular follow-up appointments. You should make sure you get enough calcium and vitamin D while you are taking this medication. Discuss the foods you eat and the vitamins you take with your care team. Talk to your care team if you are pregnant or think you might be pregnant. This medication can cause serious birth defects if taken during pregnancy and for 5 months after the last dose. You will need a negative pregnancy test before starting this medication. Contraception is recommended while taking this medication and for 5 months after the last dose. Your care   team can help you find the option that works for you. Talk to your care team before breastfeeding. Changes to your treatment plan may be needed. What side effects may I notice from receiving this medication? Side effects that you should report to your care team as soon as  possible: Allergic reactions--skin rash, itching, hives, swelling of the face, lips, tongue, or throat Infection--fever, chills, cough, sore throat, wounds that don't heal, pain or trouble when passing urine, general feeling of discomfort or being unwell Low calcium level--muscle pain or cramps, confusion, tingling, or numbness in the hands or feet Osteonecrosis of the jaw--pain, swelling, or redness in the mouth, numbness of the jaw, poor healing after dental work, unusual discharge from the mouth, visible bones in the mouth Severe bone, joint, or muscle pain Skin infection--skin redness, swelling, warmth, or pain Side effects that usually do not require medical attention (report these to your care team if they continue or are bothersome): Back pain Headache Joint pain Muscle pain Pain in the hands, arms, legs, or feet Runny or stuffy nose Sore throat This list may not describe all possible side effects. Call your doctor for medical advice about side effects. You may report side effects to FDA at 1-800-FDA-1088. Where should I keep my medication? This medication is given in a hospital or clinic. It will not be stored at home. NOTE: This sheet is a summary. It may not cover all possible information. If you have questions about this medicine, talk to your doctor, pharmacist, or health care provider.  2023 Elsevier/Gold Standard (2021-07-16 00:00:00)  

## 2022-06-04 NOTE — Progress Notes (Signed)
Diagnosis: Osteoporosis  Provider:  Marshell Garfinkel MD  Procedure: Injection  Prolia (Denosumab), Dose: 60 mg, Site: subcutaneous, Number of injections: 1  Post Care: Observation period completed  Discharge: Condition: Good, Destination: Home . AVS Provided and AVS Declined  Performed by:  Oren Beckmann, RN

## 2022-09-03 DIAGNOSIS — F331 Major depressive disorder, recurrent, moderate: Secondary | ICD-10-CM | POA: Diagnosis not present

## 2022-09-03 DIAGNOSIS — G4700 Insomnia, unspecified: Secondary | ICD-10-CM | POA: Diagnosis not present

## 2022-09-10 DIAGNOSIS — M25512 Pain in left shoulder: Secondary | ICD-10-CM | POA: Diagnosis not present

## 2022-09-10 DIAGNOSIS — M25521 Pain in right elbow: Secondary | ICD-10-CM | POA: Diagnosis not present

## 2022-09-16 DIAGNOSIS — R7989 Other specified abnormal findings of blood chemistry: Secondary | ICD-10-CM | POA: Diagnosis not present

## 2022-09-16 DIAGNOSIS — Z1212 Encounter for screening for malignant neoplasm of rectum: Secondary | ICD-10-CM | POA: Diagnosis not present

## 2022-09-16 DIAGNOSIS — D649 Anemia, unspecified: Secondary | ICD-10-CM | POA: Diagnosis not present

## 2022-09-16 DIAGNOSIS — E559 Vitamin D deficiency, unspecified: Secondary | ICD-10-CM | POA: Diagnosis not present

## 2022-09-16 DIAGNOSIS — E538 Deficiency of other specified B group vitamins: Secondary | ICD-10-CM | POA: Diagnosis not present

## 2022-09-22 DIAGNOSIS — D649 Anemia, unspecified: Secondary | ICD-10-CM | POA: Diagnosis not present

## 2022-09-22 DIAGNOSIS — E538 Deficiency of other specified B group vitamins: Secondary | ICD-10-CM | POA: Diagnosis not present

## 2022-09-22 DIAGNOSIS — Z1212 Encounter for screening for malignant neoplasm of rectum: Secondary | ICD-10-CM | POA: Diagnosis not present

## 2022-09-23 DIAGNOSIS — M797 Fibromyalgia: Secondary | ICD-10-CM | POA: Diagnosis not present

## 2022-09-23 DIAGNOSIS — E538 Deficiency of other specified B group vitamins: Secondary | ICD-10-CM | POA: Diagnosis not present

## 2022-09-23 DIAGNOSIS — Z1331 Encounter for screening for depression: Secondary | ICD-10-CM | POA: Diagnosis not present

## 2022-09-23 DIAGNOSIS — M1990 Unspecified osteoarthritis, unspecified site: Secondary | ICD-10-CM | POA: Diagnosis not present

## 2022-09-23 DIAGNOSIS — Z0000 Encounter for general adult medical examination without abnormal findings: Secondary | ICD-10-CM | POA: Diagnosis not present

## 2022-09-23 DIAGNOSIS — R82998 Other abnormal findings in urine: Secondary | ICD-10-CM | POA: Diagnosis not present

## 2022-09-23 DIAGNOSIS — R002 Palpitations: Secondary | ICD-10-CM | POA: Diagnosis not present

## 2022-09-23 DIAGNOSIS — D72819 Decreased white blood cell count, unspecified: Secondary | ICD-10-CM | POA: Diagnosis not present

## 2022-09-23 DIAGNOSIS — G4700 Insomnia, unspecified: Secondary | ICD-10-CM | POA: Diagnosis not present

## 2022-09-23 DIAGNOSIS — F325 Major depressive disorder, single episode, in full remission: Secondary | ICD-10-CM | POA: Diagnosis not present

## 2022-09-23 DIAGNOSIS — M810 Age-related osteoporosis without current pathological fracture: Secondary | ICD-10-CM | POA: Diagnosis not present

## 2022-09-23 DIAGNOSIS — D649 Anemia, unspecified: Secondary | ICD-10-CM | POA: Diagnosis not present

## 2022-09-24 DIAGNOSIS — M7701 Medial epicondylitis, right elbow: Secondary | ICD-10-CM | POA: Diagnosis not present

## 2022-10-15 DIAGNOSIS — N952 Postmenopausal atrophic vaginitis: Secondary | ICD-10-CM | POA: Diagnosis not present

## 2022-10-15 DIAGNOSIS — M810 Age-related osteoporosis without current pathological fracture: Secondary | ICD-10-CM | POA: Diagnosis not present

## 2022-10-15 DIAGNOSIS — N94810 Vulvar vestibulitis: Secondary | ICD-10-CM | POA: Diagnosis not present

## 2022-10-15 DIAGNOSIS — Z01419 Encounter for gynecological examination (general) (routine) without abnormal findings: Secondary | ICD-10-CM | POA: Diagnosis not present

## 2022-10-29 DIAGNOSIS — G4700 Insomnia, unspecified: Secondary | ICD-10-CM | POA: Diagnosis not present

## 2022-10-29 DIAGNOSIS — F331 Major depressive disorder, recurrent, moderate: Secondary | ICD-10-CM | POA: Diagnosis not present

## 2022-12-05 ENCOUNTER — Ambulatory Visit: Payer: Medicare Other

## 2022-12-26 ENCOUNTER — Ambulatory Visit: Payer: Medicare Other

## 2022-12-26 VITALS — BP 110/73 | HR 68 | Temp 97.8°F | Resp 16 | Ht 64.0 in | Wt 152.0 lb

## 2022-12-26 DIAGNOSIS — M818 Other osteoporosis without current pathological fracture: Secondary | ICD-10-CM | POA: Diagnosis not present

## 2022-12-26 MED ORDER — DENOSUMAB 60 MG/ML ~~LOC~~ SOSY
60.0000 mg | PREFILLED_SYRINGE | Freq: Once | SUBCUTANEOUS | Status: AC
  Administered 2022-12-26: 60 mg via SUBCUTANEOUS
  Filled 2022-12-26: qty 1

## 2022-12-26 NOTE — Progress Notes (Signed)
 Diagnosis: Osteoporosis  Provider:  Chilton Greathouse MD  Procedure: Injection  Prolia (Denosumab), Dose: 60 mg, Site: subcutaneous, Number of injections: 1  Post Care: Patient declined observation  Discharge: Condition: Good, Destination: Home . AVS Declined  Performed by:  Loney Hering, LPN

## 2023-01-01 DIAGNOSIS — Z23 Encounter for immunization: Secondary | ICD-10-CM | POA: Diagnosis not present

## 2023-01-17 ENCOUNTER — Other Ambulatory Visit: Payer: Self-pay | Admitting: Medical Genetics

## 2023-01-17 DIAGNOSIS — Z006 Encounter for examination for normal comparison and control in clinical research program: Secondary | ICD-10-CM

## 2023-02-11 ENCOUNTER — Other Ambulatory Visit (HOSPITAL_COMMUNITY)
Admission: RE | Admit: 2023-02-11 | Discharge: 2023-02-11 | Disposition: A | Discharge status: Home / Self Care | Payer: Medicare Other | Source: Ambulatory Visit | Attending: Oncology | Admitting: Oncology

## 2023-02-11 DIAGNOSIS — Z006 Encounter for examination for normal comparison and control in clinical research program: Secondary | ICD-10-CM | POA: Insufficient documentation

## 2023-02-12 DIAGNOSIS — F331 Major depressive disorder, recurrent, moderate: Secondary | ICD-10-CM | POA: Diagnosis not present

## 2023-02-12 DIAGNOSIS — G4700 Insomnia, unspecified: Secondary | ICD-10-CM | POA: Diagnosis not present

## 2023-02-24 LAB — GENECONNECT MOLECULAR SCREEN: Genetic Analysis Overall Interpretation: NEGATIVE

## 2023-02-28 DIAGNOSIS — Z23 Encounter for immunization: Secondary | ICD-10-CM | POA: Diagnosis not present

## 2023-04-30 ENCOUNTER — Telehealth: Payer: Self-pay

## 2023-04-30 NOTE — Telephone Encounter (Signed)
Auth Submission: NO AUTH NEEDED Payer: MEDICARE A/B & AARP supp Medication & CPT/J Code(s) submitted: Prolia (Denosumab) [id number] Route of submission (phone, fax, portal):  Phone # Fax # Auth type: Buy/Bill Units/visits requested: 60mg  x 2 doses Reference number:  Approval from: 05/23/22 to 04/17/24

## 2023-05-13 ENCOUNTER — Other Ambulatory Visit: Payer: Self-pay | Admitting: Obstetrics and Gynecology

## 2023-05-13 DIAGNOSIS — Z0000 Encounter for general adult medical examination without abnormal findings: Secondary | ICD-10-CM

## 2023-06-05 ENCOUNTER — Ambulatory Visit
Admission: RE | Admit: 2023-06-05 | Discharge: 2023-06-05 | Disposition: A | Discharge status: Home / Self Care | Source: Ambulatory Visit | Attending: Obstetrics and Gynecology | Admitting: Obstetrics and Gynecology

## 2023-06-05 DIAGNOSIS — Z1231 Encounter for screening mammogram for malignant neoplasm of breast: Secondary | ICD-10-CM | POA: Diagnosis not present

## 2023-06-05 DIAGNOSIS — Z0000 Encounter for general adult medical examination without abnormal findings: Secondary | ICD-10-CM

## 2023-06-05 HISTORY — DX: Encounter for nonprocreative screening for genetic disease carrier status: Z13.71

## 2023-06-17 DIAGNOSIS — F331 Major depressive disorder, recurrent, moderate: Secondary | ICD-10-CM | POA: Diagnosis not present

## 2023-06-17 DIAGNOSIS — G4700 Insomnia, unspecified: Secondary | ICD-10-CM | POA: Diagnosis not present

## 2023-06-20 ENCOUNTER — Emergency Department (HOSPITAL_COMMUNITY)
Admission: EM | Admit: 2023-06-20 | Discharge: 2023-06-20 | Disposition: A | Discharge status: Home / Self Care | Attending: Emergency Medicine | Admitting: Emergency Medicine

## 2023-06-20 ENCOUNTER — Other Ambulatory Visit: Payer: Self-pay

## 2023-06-20 ENCOUNTER — Encounter (HOSPITAL_COMMUNITY): Payer: Self-pay

## 2023-06-20 DIAGNOSIS — R55 Syncope and collapse: Secondary | ICD-10-CM | POA: Insufficient documentation

## 2023-06-20 LAB — CBC WITH DIFFERENTIAL/PLATELET
Abs Immature Granulocytes: 0.03 10*3/uL (ref 0.00–0.07)
Basophils Absolute: 0.1 10*3/uL (ref 0.0–0.1)
Basophils Relative: 1 %
Eosinophils Absolute: 0.1 10*3/uL (ref 0.0–0.5)
Eosinophils Relative: 2 %
HCT: 37.6 % (ref 36.0–46.0)
Hemoglobin: 12.4 g/dL (ref 12.0–15.0)
Immature Granulocytes: 0 %
Lymphocytes Relative: 23 %
Lymphs Abs: 1.5 10*3/uL (ref 0.7–4.0)
MCH: 28.1 pg (ref 26.0–34.0)
MCHC: 33 g/dL (ref 30.0–36.0)
MCV: 85.3 fL (ref 80.0–100.0)
Monocytes Absolute: 0.5 10*3/uL (ref 0.1–1.0)
Monocytes Relative: 7 %
Neutro Abs: 4.6 10*3/uL (ref 1.7–7.7)
Neutrophils Relative %: 67 %
Platelets: 216 10*3/uL (ref 150–400)
RBC: 4.41 MIL/uL (ref 3.87–5.11)
RDW: 13.6 % (ref 11.5–15.5)
WBC: 6.8 10*3/uL (ref 4.0–10.5)
nRBC: 0 % (ref 0.0–0.2)

## 2023-06-20 LAB — BASIC METABOLIC PANEL WITH GFR
Anion gap: 9 (ref 5–15)
BUN: 27 mg/dL — ABNORMAL HIGH (ref 8–23)
CO2: 22 mmol/L (ref 22–32)
Calcium: 9.1 mg/dL (ref 8.9–10.3)
Chloride: 102 mmol/L (ref 98–111)
Creatinine, Ser: 0.92 mg/dL (ref 0.44–1.00)
GFR, Estimated: >60 mL/min (ref >60)
Glucose, Bld: 141 mg/dL — ABNORMAL HIGH (ref 70–99)
Potassium: 3.9 mmol/L (ref 3.5–5.1)
Sodium: 133 mmol/L — ABNORMAL LOW (ref 135–145)

## 2023-06-20 MED ORDER — ONDANSETRON HCL 4 MG/2ML IJ SOLN
4.0000 mg | Freq: Once | INTRAMUSCULAR | Status: AC
  Administered 2023-06-20: 4 mg via INTRAVENOUS
  Filled 2023-06-20: qty 2

## 2023-06-20 MED ORDER — SODIUM CHLORIDE 0.9 % IV BOLUS
1000.0000 mL | Freq: Once | INTRAVENOUS | Status: AC
  Administered 2023-06-20: 1000 mL via INTRAVENOUS
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
## 2023-06-20 NOTE — Discharge Instructions (Signed)
Return for any problem.  ? ?Drink plenty of fluids. ?

## 2023-07-01 ENCOUNTER — Ambulatory Visit

## 2023-07-01 ENCOUNTER — Ambulatory Visit: Payer: Medicare Other

## 2023-07-01 VITALS — BP 97/64 | HR 69 | Temp 98.1°F | Resp 20 | Ht 64.0 in | Wt 156.6 lb

## 2023-07-01 DIAGNOSIS — M818 Other osteoporosis without current pathological fracture: Secondary | ICD-10-CM

## 2023-07-01 DIAGNOSIS — M810 Age-related osteoporosis without current pathological fracture: Secondary | ICD-10-CM | POA: Diagnosis not present

## 2023-07-01 MED ORDER — DENOSUMAB 60 MG/ML ~~LOC~~ SOSY
60.0000 mg | PREFILLED_SYRINGE | Freq: Once | SUBCUTANEOUS | Status: AC
  Administered 2023-07-01: 60 mg via SUBCUTANEOUS
  Filled 2023-07-01: qty 1

## 2023-07-01 NOTE — Progress Notes (Signed)
 Diagnosis: Osteoporosis  Provider:  Mannam, Praveen MD  Procedure: Injection  Prolia (Denosumab), Dose: 60 mg, Site: subcutaneous, Number of injections: 1  Injection Site(s): Right lower quad. abdomne    Discharge: Condition: Good, Destination: Home . AVS Declined  Performed by:  Coline Calkin, RN

## 2023-07-09 DIAGNOSIS — E559 Vitamin D deficiency, unspecified: Secondary | ICD-10-CM | POA: Diagnosis not present

## 2023-07-09 DIAGNOSIS — M810 Age-related osteoporosis without current pathological fracture: Secondary | ICD-10-CM | POA: Diagnosis not present

## 2023-08-12 DIAGNOSIS — F332 Major depressive disorder, recurrent severe without psychotic features: Secondary | ICD-10-CM | POA: Diagnosis not present

## 2023-08-12 DIAGNOSIS — G4726 Circadian rhythm sleep disorder, shift work type: Secondary | ICD-10-CM | POA: Diagnosis not present

## 2023-09-18 DIAGNOSIS — D649 Anemia, unspecified: Secondary | ICD-10-CM | POA: Diagnosis not present

## 2023-09-18 DIAGNOSIS — D72819 Decreased white blood cell count, unspecified: Secondary | ICD-10-CM | POA: Diagnosis not present

## 2023-09-18 DIAGNOSIS — Z1389 Encounter for screening for other disorder: Secondary | ICD-10-CM | POA: Diagnosis not present

## 2023-09-18 DIAGNOSIS — R7989 Other specified abnormal findings of blood chemistry: Secondary | ICD-10-CM | POA: Diagnosis not present

## 2023-09-18 DIAGNOSIS — E538 Deficiency of other specified B group vitamins: Secondary | ICD-10-CM | POA: Diagnosis not present

## 2023-09-18 DIAGNOSIS — Z79899 Other long term (current) drug therapy: Secondary | ICD-10-CM | POA: Diagnosis not present

## 2023-09-18 DIAGNOSIS — E559 Vitamin D deficiency, unspecified: Secondary | ICD-10-CM | POA: Diagnosis not present

## 2023-09-23 DIAGNOSIS — K589 Irritable bowel syndrome without diarrhea: Secondary | ICD-10-CM | POA: Diagnosis not present

## 2023-09-23 DIAGNOSIS — D72819 Decreased white blood cell count, unspecified: Secondary | ICD-10-CM | POA: Diagnosis not present

## 2023-09-23 DIAGNOSIS — Z1339 Encounter for screening examination for other mental health and behavioral disorders: Secondary | ICD-10-CM | POA: Diagnosis not present

## 2023-09-23 DIAGNOSIS — F4320 Adjustment disorder, unspecified: Secondary | ICD-10-CM | POA: Diagnosis not present

## 2023-09-23 DIAGNOSIS — E538 Deficiency of other specified B group vitamins: Secondary | ICD-10-CM | POA: Diagnosis not present

## 2023-09-23 DIAGNOSIS — M810 Age-related osteoporosis without current pathological fracture: Secondary | ICD-10-CM | POA: Diagnosis not present

## 2023-09-23 DIAGNOSIS — Z1331 Encounter for screening for depression: Secondary | ICD-10-CM | POA: Diagnosis not present

## 2023-09-23 DIAGNOSIS — G4700 Insomnia, unspecified: Secondary | ICD-10-CM | POA: Diagnosis not present

## 2023-09-23 DIAGNOSIS — D649 Anemia, unspecified: Secondary | ICD-10-CM | POA: Diagnosis not present

## 2023-09-23 DIAGNOSIS — Z0000 Encounter for general adult medical examination without abnormal findings: Secondary | ICD-10-CM | POA: Diagnosis not present

## 2023-09-23 DIAGNOSIS — F325 Major depressive disorder, single episode, in full remission: Secondary | ICD-10-CM | POA: Diagnosis not present

## 2023-09-23 DIAGNOSIS — Z23 Encounter for immunization: Secondary | ICD-10-CM | POA: Diagnosis not present

## 2023-09-23 DIAGNOSIS — R82998 Other abnormal findings in urine: Secondary | ICD-10-CM | POA: Diagnosis not present

## 2023-12-03 DIAGNOSIS — M25551 Pain in right hip: Secondary | ICD-10-CM | POA: Diagnosis not present

## 2023-12-03 DIAGNOSIS — M47816 Spondylosis without myelopathy or radiculopathy, lumbar region: Secondary | ICD-10-CM | POA: Diagnosis not present

## 2023-12-11 DIAGNOSIS — F332 Major depressive disorder, recurrent severe without psychotic features: Secondary | ICD-10-CM | POA: Diagnosis not present

## 2023-12-11 DIAGNOSIS — G4726 Circadian rhythm sleep disorder, shift work type: Secondary | ICD-10-CM | POA: Diagnosis not present

## 2024-01-01 ENCOUNTER — Ambulatory Visit

## 2024-01-01 VITALS — BP 95/60 | HR 63 | Temp 97.9°F | Resp 14 | Ht 64.0 in | Wt 154.6 lb

## 2024-01-01 DIAGNOSIS — M818 Other osteoporosis without current pathological fracture: Secondary | ICD-10-CM

## 2024-01-01 MED ORDER — DENOSUMAB 60 MG/ML ~~LOC~~ SOSY
60.0000 mg | PREFILLED_SYRINGE | Freq: Once | SUBCUTANEOUS | Status: AC
  Administered 2024-01-01: 60 mg via SUBCUTANEOUS
  Filled 2024-01-01: qty 1

## 2024-01-01 NOTE — Progress Notes (Signed)
 Diagnosis: Osteoporosis  Provider:  Mannam, Praveen MD  Procedure: Injection  Prolia  (Denosumab ), Dose: 60 mg, Site: subcutaneous, Number of injections: 1  Injection Site(s): Left lower quad. abdomen  Post Care: Patient declined observation  Discharge: Condition: Good, Destination: Home . AVS Declined  Performed by:  Leita FORBES Miles, LPN

## 2024-01-08 DIAGNOSIS — Z23 Encounter for immunization: Secondary | ICD-10-CM | POA: Diagnosis not present

## 2024-01-30 DIAGNOSIS — Z23 Encounter for immunization: Secondary | ICD-10-CM | POA: Diagnosis not present

## 2024-02-03 DIAGNOSIS — T792XXA Traumatic secondary and recurrent hemorrhage and seroma, initial encounter: Secondary | ICD-10-CM | POA: Diagnosis not present

## 2024-02-03 DIAGNOSIS — M25551 Pain in right hip: Secondary | ICD-10-CM | POA: Diagnosis not present

## 2024-02-06 DIAGNOSIS — D649 Anemia, unspecified: Secondary | ICD-10-CM | POA: Diagnosis not present

## 2024-02-09 ENCOUNTER — Other Ambulatory Visit (HOSPITAL_COMMUNITY): Payer: Self-pay | Admitting: Internal Medicine

## 2024-02-09 ENCOUNTER — Encounter (HOSPITAL_COMMUNITY): Payer: Self-pay | Admitting: Internal Medicine

## 2024-02-09 DIAGNOSIS — D649 Anemia, unspecified: Secondary | ICD-10-CM | POA: Insufficient documentation

## 2024-02-09 DIAGNOSIS — R002 Palpitations: Secondary | ICD-10-CM

## 2024-02-09 DIAGNOSIS — R55 Syncope and collapse: Secondary | ICD-10-CM

## 2024-02-10 ENCOUNTER — Telehealth: Payer: Self-pay

## 2024-02-10 DIAGNOSIS — M25551 Pain in right hip: Secondary | ICD-10-CM | POA: Diagnosis not present

## 2024-02-10 DIAGNOSIS — T792XXA Traumatic secondary and recurrent hemorrhage and seroma, initial encounter: Secondary | ICD-10-CM | POA: Diagnosis not present

## 2024-02-10 NOTE — Telephone Encounter (Signed)
 Dr. Onita, patient will be scheduled as soon as possible.  Auth Submission: NO AUTH NEEDED Site of care: Site of care: [facility] Payer: Medicare A/B with AARP supplement Medication & CPT/J Code(s) submitted: Feraheme (ferumoxytol) [id number] Diagnosis Code:  Route of submission (phone, fax, portal):  Phone # Fax # Auth type: Buy/Bill PB Units/visits requested: 510mg  x 2 doses Reference number:  Approval from: 02/10/24 to 03/17/24

## 2024-02-19 ENCOUNTER — Ambulatory Visit

## 2024-02-19 VITALS — BP 103/67 | HR 59 | Temp 97.5°F | Resp 16 | Ht 64.0 in | Wt 156.2 lb

## 2024-02-19 DIAGNOSIS — D649 Anemia, unspecified: Secondary | ICD-10-CM

## 2024-02-19 MED ORDER — SODIUM CHLORIDE 0.9 % IV SOLN
510.0000 mg | Freq: Once | INTRAVENOUS | Status: AC
  Administered 2024-02-19: 510 mg via INTRAVENOUS
  Filled 2024-02-19: qty 17

## 2024-02-19 NOTE — Progress Notes (Signed)
 Diagnosis: Acute Anemia  Provider:  Mannam, Praveen MD  Procedure: IV Infusion  IV Type: Peripheral, IV Location: L Antecubital  Feraheme (Ferumoxytol), Dose: 510 mg  Infusion Start Time: 1403  Infusion Stop Time: 1419  Post Infusion IV Care: Observation period completed and Peripheral IV Discontinued  Discharge: Condition: Good, Destination: Home . AVS Declined  Performed by:  Rocky FORBES Sar, RN

## 2024-02-26 ENCOUNTER — Ambulatory Visit (INDEPENDENT_AMBULATORY_CARE_PROVIDER_SITE_OTHER)

## 2024-02-26 VITALS — BP 102/56 | HR 63 | Temp 97.7°F | Resp 16 | Ht 64.0 in | Wt 153.8 lb

## 2024-02-26 DIAGNOSIS — D649 Anemia, unspecified: Secondary | ICD-10-CM

## 2024-02-26 MED ORDER — SODIUM CHLORIDE 0.9 % IV SOLN
510.0000 mg | Freq: Once | INTRAVENOUS | Status: AC
  Administered 2024-02-26: 510 mg via INTRAVENOUS
  Filled 2024-02-26: qty 17

## 2024-02-26 NOTE — Addendum Note (Signed)
 Addended by: Maggi Hershkowitz E on: 02/26/2024 03:19 PM   Modules accepted: Orders

## 2024-02-26 NOTE — Progress Notes (Signed)
 Diagnosis: Acute Anemia  Provider:  Mannam, Praveen MD  Procedure: IV Infusion  IV Type: Peripheral, IV Location: L Antecubital  Feraheme  (Ferumoxytol ), Dose: 510 mg  Infusion Start Time: 1407  Infusion Stop Time: 1424  Post Infusion IV Care: Observation period completed  Discharge: Condition: Good, Destination: Home . AVS Declined  Performed by:  Rocky FORBES Sar, RN

## 2024-03-02 DIAGNOSIS — G4726 Circadian rhythm sleep disorder, shift work type: Secondary | ICD-10-CM | POA: Diagnosis not present

## 2024-03-02 DIAGNOSIS — F332 Major depressive disorder, recurrent severe without psychotic features: Secondary | ICD-10-CM | POA: Diagnosis not present

## 2024-03-23 ENCOUNTER — Ambulatory Visit (HOSPITAL_COMMUNITY)
Admission: RE | Admit: 2024-03-23 | Discharge: 2024-03-23 | Disposition: A | Discharge status: Home / Self Care | Source: Ambulatory Visit | Attending: Internal Medicine | Admitting: Internal Medicine

## 2024-03-23 DIAGNOSIS — R55 Syncope and collapse: Secondary | ICD-10-CM | POA: Diagnosis present

## 2024-03-23 LAB — ECHOCARDIOGRAM COMPLETE
Area-P 1/2: 3.53 cm2
S' Lateral: 2.5 cm

## 2024-04-01 ENCOUNTER — Telehealth (HOSPITAL_COMMUNITY): Payer: Self-pay | Admitting: Pharmacist

## 2024-04-01 NOTE — Telephone Encounter (Signed)
 Patient aware of SOC change to [facility] infusion  Needs updated Prolia  order from Dr. Larance office - last sent March 2024. Faxed referral form requesting updated clinicals and labs  Sherry Pennant, PharmD, MPH, BCPS, CPP Clinical Pharmacist

## 2024-04-21 ENCOUNTER — Other Ambulatory Visit: Payer: Self-pay | Admitting: Obstetrics and Gynecology

## 2024-04-21 DIAGNOSIS — Z1231 Encounter for screening mammogram for malignant neoplasm of breast: Secondary | ICD-10-CM

## 2024-06-08 ENCOUNTER — Ambulatory Visit

## 2024-07-01 ENCOUNTER — Ambulatory Visit
